# Patient Record
Sex: Female | Born: 1960 | Race: Black or African American | Hispanic: No | Marital: Married | Smoking: Never smoker
Health system: Southern US, Community
[De-identification: ages and names within clinical notes are randomized; demographics above are authoritative.]

## PROBLEM LIST (undated history)

## (undated) DIAGNOSIS — F3289 Other specified depressive episodes: Secondary | ICD-10-CM

## (undated) DIAGNOSIS — G43909 Migraine, unspecified, not intractable, without status migrainosus: Secondary | ICD-10-CM

## (undated) DIAGNOSIS — IMO0002 Reserved for concepts with insufficient information to code with codable children: Secondary | ICD-10-CM

## (undated) DIAGNOSIS — K219 Gastro-esophageal reflux disease without esophagitis: Secondary | ICD-10-CM

## (undated) DIAGNOSIS — M1990 Unspecified osteoarthritis, unspecified site: Secondary | ICD-10-CM

## (undated) DIAGNOSIS — H409 Unspecified glaucoma: Secondary | ICD-10-CM

## (undated) DIAGNOSIS — E785 Hyperlipidemia, unspecified: Secondary | ICD-10-CM

## (undated) DIAGNOSIS — F329 Major depressive disorder, single episode, unspecified: Secondary | ICD-10-CM

## (undated) DIAGNOSIS — R42 Dizziness and giddiness: Secondary | ICD-10-CM

## (undated) DIAGNOSIS — F419 Anxiety disorder, unspecified: Secondary | ICD-10-CM

## (undated) DIAGNOSIS — M129 Arthropathy, unspecified: Secondary | ICD-10-CM

## (undated) DIAGNOSIS — I10 Essential (primary) hypertension: Secondary | ICD-10-CM

## (undated) DIAGNOSIS — R002 Palpitations: Secondary | ICD-10-CM

## (undated) HISTORY — DX: Essential (primary) hypertension: I10

## (undated) HISTORY — DX: Anxiety disorder, unspecified: F41.9

## (undated) HISTORY — PX: BREAST SURGERY: SHX581

## (undated) HISTORY — DX: Unspecified glaucoma: H40.9

## (undated) HISTORY — DX: Other specified depressive episodes: F32.89

## (undated) HISTORY — DX: Dizziness and giddiness: R42

## (undated) HISTORY — DX: Unspecified osteoarthritis, unspecified site: M19.90

## (undated) HISTORY — DX: Major depressive disorder, single episode, unspecified: F32.9

## (undated) HISTORY — PX: SPINE SURGERY: SHX786

## (undated) HISTORY — DX: Arthropathy, unspecified: M12.9

## (undated) HISTORY — PX: SHOULDER SURGERY: SHX246

## (undated) HISTORY — DX: Reserved for concepts with insufficient information to code with codable children: IMO0002

## (undated) HISTORY — DX: Palpitations: R00.2

## (undated) HISTORY — DX: Gastro-esophageal reflux disease without esophagitis: K21.9

## (undated) HISTORY — DX: Hyperlipidemia, unspecified: E78.5

---

## 1982-05-20 DIAGNOSIS — R87619 Unspecified abnormal cytological findings in specimens from cervix uteri: Secondary | ICD-10-CM

## 1982-05-20 DIAGNOSIS — IMO0002 Reserved for concepts with insufficient information to code with codable children: Secondary | ICD-10-CM

## 1982-05-20 HISTORY — DX: Reserved for concepts with insufficient information to code with codable children: IMO0002

## 1982-05-20 HISTORY — PX: CERVIX LESION DESTRUCTION: SHX591

## 1982-05-20 HISTORY — DX: Unspecified abnormal cytological findings in specimens from cervix uteri: R87.619

## 1988-05-20 HISTORY — PX: TUBAL LIGATION: SHX77

## 1996-05-20 HISTORY — PX: OTHER SURGICAL HISTORY: SHX169

## 1998-07-26 ENCOUNTER — Other Ambulatory Visit: Admission: RE | Admit: 1998-07-26 | Discharge: 1998-07-26 | Payer: Self-pay | Admitting: *Deleted

## 1999-10-17 ENCOUNTER — Other Ambulatory Visit: Admission: RE | Admit: 1999-10-17 | Discharge: 1999-10-17 | Payer: Self-pay | Admitting: Obstetrics and Gynecology

## 2000-10-02 ENCOUNTER — Other Ambulatory Visit: Admission: RE | Admit: 2000-10-02 | Discharge: 2000-10-02 | Payer: Self-pay | Admitting: *Deleted

## 2000-12-12 ENCOUNTER — Encounter: Admission: RE | Admit: 2000-12-12 | Discharge: 2000-12-12 | Payer: Self-pay | Admitting: Obstetrics and Gynecology

## 2000-12-12 ENCOUNTER — Encounter: Payer: Self-pay | Admitting: Obstetrics and Gynecology

## 2001-11-12 ENCOUNTER — Other Ambulatory Visit: Admission: RE | Admit: 2001-11-12 | Discharge: 2001-11-12 | Payer: Self-pay | Admitting: Obstetrics and Gynecology

## 2002-12-01 ENCOUNTER — Other Ambulatory Visit: Admission: RE | Admit: 2002-12-01 | Discharge: 2002-12-01 | Payer: Self-pay | Admitting: Obstetrics and Gynecology

## 2003-05-25 ENCOUNTER — Other Ambulatory Visit: Admission: RE | Admit: 2003-05-25 | Discharge: 2003-05-25 | Payer: Self-pay | Admitting: Family Medicine

## 2004-04-18 ENCOUNTER — Other Ambulatory Visit: Admission: RE | Admit: 2004-04-18 | Discharge: 2004-04-18 | Payer: Self-pay | Admitting: *Deleted

## 2004-05-20 HISTORY — PX: SKIN GRAFT: SHX250

## 2004-08-20 ENCOUNTER — Emergency Department (HOSPITAL_COMMUNITY): Admission: EM | Admit: 2004-08-20 | Discharge: 2004-08-20 | Payer: Self-pay | Admitting: Emergency Medicine

## 2005-08-05 ENCOUNTER — Other Ambulatory Visit: Admission: RE | Admit: 2005-08-05 | Discharge: 2005-08-05 | Payer: Self-pay | Admitting: Obstetrics and Gynecology

## 2005-08-07 ENCOUNTER — Encounter: Admission: RE | Admit: 2005-08-07 | Discharge: 2005-08-07 | Payer: Self-pay | Admitting: Obstetrics and Gynecology

## 2005-12-31 ENCOUNTER — Ambulatory Visit (HOSPITAL_COMMUNITY): Admission: RE | Admit: 2005-12-31 | Discharge: 2006-01-01 | Payer: Self-pay | Admitting: Obstetrics & Gynecology

## 2005-12-31 ENCOUNTER — Encounter (INDEPENDENT_AMBULATORY_CARE_PROVIDER_SITE_OTHER): Payer: Self-pay | Admitting: *Deleted

## 2006-05-20 HISTORY — PX: ABDOMINAL HYSTERECTOMY: SHX81

## 2006-05-20 HISTORY — PX: OTHER SURGICAL HISTORY: SHX169

## 2006-07-25 ENCOUNTER — Other Ambulatory Visit: Admission: RE | Admit: 2006-07-25 | Discharge: 2006-07-25 | Payer: Self-pay | Admitting: Obstetrics & Gynecology

## 2006-08-19 ENCOUNTER — Emergency Department (HOSPITAL_COMMUNITY): Admission: EM | Admit: 2006-08-19 | Discharge: 2006-08-19 | Payer: Self-pay | Admitting: Family Medicine

## 2007-02-03 ENCOUNTER — Encounter: Admission: RE | Admit: 2007-02-03 | Discharge: 2007-02-03 | Payer: Self-pay | Admitting: Family Medicine

## 2008-05-20 HISTORY — PX: COLONOSCOPY: SHX174

## 2008-06-13 ENCOUNTER — Other Ambulatory Visit: Admission: RE | Admit: 2008-06-13 | Discharge: 2008-06-13 | Payer: Self-pay | Admitting: Obstetrics and Gynecology

## 2008-07-18 LAB — HM MAMMOGRAPHY: HM Mammogram: NORMAL

## 2008-09-27 DIAGNOSIS — K5900 Constipation, unspecified: Secondary | ICD-10-CM | POA: Insufficient documentation

## 2008-09-27 DIAGNOSIS — K219 Gastro-esophageal reflux disease without esophagitis: Secondary | ICD-10-CM | POA: Insufficient documentation

## 2008-09-27 DIAGNOSIS — Z87898 Personal history of other specified conditions: Secondary | ICD-10-CM | POA: Insufficient documentation

## 2008-09-28 ENCOUNTER — Encounter (INDEPENDENT_AMBULATORY_CARE_PROVIDER_SITE_OTHER): Payer: Self-pay | Admitting: *Deleted

## 2008-09-28 ENCOUNTER — Ambulatory Visit: Payer: Self-pay | Admitting: Internal Medicine

## 2008-09-28 DIAGNOSIS — F329 Major depressive disorder, single episode, unspecified: Secondary | ICD-10-CM

## 2008-09-28 DIAGNOSIS — Z9189 Other specified personal risk factors, not elsewhere classified: Secondary | ICD-10-CM | POA: Insufficient documentation

## 2008-09-28 DIAGNOSIS — R51 Headache: Secondary | ICD-10-CM | POA: Insufficient documentation

## 2008-09-28 DIAGNOSIS — J40 Bronchitis, not specified as acute or chronic: Secondary | ICD-10-CM | POA: Insufficient documentation

## 2008-09-28 DIAGNOSIS — R519 Headache, unspecified: Secondary | ICD-10-CM | POA: Insufficient documentation

## 2008-09-28 DIAGNOSIS — F32A Depression, unspecified: Secondary | ICD-10-CM | POA: Insufficient documentation

## 2008-09-28 DIAGNOSIS — I10 Essential (primary) hypertension: Secondary | ICD-10-CM | POA: Insufficient documentation

## 2008-09-28 DIAGNOSIS — M129 Arthropathy, unspecified: Secondary | ICD-10-CM | POA: Insufficient documentation

## 2008-09-28 DIAGNOSIS — H409 Unspecified glaucoma: Secondary | ICD-10-CM | POA: Insufficient documentation

## 2008-09-28 LAB — CONVERTED CEMR LAB
Eosinophils Absolute: 0.1 10*3/uL (ref 0.0–0.7)
MCHC: 34.5 g/dL (ref 30.0–36.0)
MCV: 89.9 fL (ref 78.0–100.0)
Monocytes Absolute: 0.3 10*3/uL (ref 0.1–1.0)
Neutrophils Relative %: 51 % (ref 43.0–77.0)
Platelets: 196 10*3/uL (ref 150.0–400.0)
RDW: 15.4 % — ABNORMAL HIGH (ref 11.5–14.6)

## 2008-10-03 ENCOUNTER — Ambulatory Visit: Payer: Self-pay | Admitting: Internal Medicine

## 2008-10-11 ENCOUNTER — Ambulatory Visit: Payer: Self-pay | Admitting: Internal Medicine

## 2009-02-06 ENCOUNTER — Encounter: Admission: RE | Admit: 2009-02-06 | Discharge: 2009-02-06 | Payer: Self-pay | Admitting: Specialist

## 2009-03-15 ENCOUNTER — Emergency Department (HOSPITAL_COMMUNITY): Admission: EM | Admit: 2009-03-15 | Discharge: 2009-03-15 | Payer: Self-pay | Admitting: Emergency Medicine

## 2009-06-20 ENCOUNTER — Encounter: Payer: Self-pay | Admitting: Internal Medicine

## 2009-06-20 HISTORY — PX: OTHER SURGICAL HISTORY: SHX169

## 2009-06-28 ENCOUNTER — Inpatient Hospital Stay (HOSPITAL_COMMUNITY): Admission: RE | Admit: 2009-06-28 | Discharge: 2009-06-29 | Payer: Self-pay | Admitting: *Deleted

## 2009-08-08 ENCOUNTER — Encounter: Payer: Self-pay | Admitting: Internal Medicine

## 2010-02-02 ENCOUNTER — Encounter: Payer: Self-pay | Admitting: Internal Medicine

## 2010-02-02 ENCOUNTER — Ambulatory Visit: Payer: Self-pay | Admitting: Internal Medicine

## 2010-02-02 DIAGNOSIS — R0789 Other chest pain: Secondary | ICD-10-CM | POA: Insufficient documentation

## 2010-03-13 ENCOUNTER — Telehealth (INDEPENDENT_AMBULATORY_CARE_PROVIDER_SITE_OTHER): Payer: Self-pay | Admitting: *Deleted

## 2010-03-14 ENCOUNTER — Ambulatory Visit (HOSPITAL_COMMUNITY): Admission: RE | Admit: 2010-03-14 | Discharge: 2010-03-14 | Payer: Self-pay | Admitting: Internal Medicine

## 2010-03-14 ENCOUNTER — Encounter: Payer: Self-pay | Admitting: Internal Medicine

## 2010-03-14 ENCOUNTER — Ambulatory Visit: Payer: Self-pay

## 2010-03-14 ENCOUNTER — Ambulatory Visit: Payer: Self-pay | Admitting: Cardiovascular Disease

## 2010-06-19 NOTE — Miscellaneous (Signed)
Summary: Appointment Canceled  Appointment status changed to canceled by [employer] on 02/27/2010 4:48 PM.  Cancellation Comments --------------------- ech/chest discomfort-mb  Appointment Information ----------------------- Appt Type:  CARDIOLOGY ANCILLARY VISIT      Date:  Wednesday, February 28, 2010      Time:  2:00 PM for 60 min   Urgency:  Routine   Made By:  Hoy Finlay Scheduler  To Visit:  [id number]    Reason:  ech/chest discomfort-mb  Appt Comments ------------- -- 02/27/10 16:48: (CEMR) CANCELED -- ech/chest discomfort-mb -- 02/14/10 13:10: (CEMR) BOOKED -- Routine CARDIOLOGY ANCILLARY VISIT at 02/28/2010 2:00 PM for 60 min ech/chest discomfort-mb

## 2010-06-19 NOTE — Progress Notes (Signed)
Summary: Stress  Echo pre procedure  Phone Note Outgoing Call Call back at Home Phone [phone]   Call placed by: Rea College, CMA,  March 13, 2010 4:20 PM Call placed to: Patient Summary of Call: Spoke with patient's ? friend/son, about instructions for stress echo.

## 2010-06-19 NOTE — Miscellaneous (Signed)
Summary: Appointment Canceled  Appointment status changed to canceled by [employer] on 02/14/2010 1:10 PM.  Cancellation Comments --------------------- ecs/chest discomfort-mb  Appointment Information ----------------------- Appt Type:  EMR NO DOCUMENT      Date:  Thursday, February 15, 2010      Time:  2:00 PM for 30 min   Urgency:  Routine   Made By:  Pearson Grippe  To Visit:  [id number]    Reason:  ecs/chest discomfort-mb  Appt Comments ------------- -- 02/14/10 13:10: (CEMR) CANCELED -- ecs/chest discomfort-mb -- 02/02/10 13:07: (CEMR) BOOKED -- Routine EMR NO DOCUMENT at 02/15/2010 2:00 PM for 30 min ecs/chest discomfort-mb

## 2010-06-19 NOTE — Letter (Signed)
Summary: [facility]  [facility]   Imported By: Lester Loving 08/31/2009 11:24:06  _____________________________________________________________________  External Attachment:    Type:   Image     Comment:   External Document

## 2010-06-19 NOTE — Miscellaneous (Signed)
Summary: Appointment Canceled  Appointment status changed to canceled by [employer] on 02/14/2010 1:10 PM.  Cancellation Comments --------------------- ech/chest discomfort-mb  Appointment Information ----------------------- Appt Type:  CARDIOLOGY ANCILLARY VISIT      Date:  Thursday, February 15, 2010      Time:  2:00 PM for 60 min   Urgency:  Routine   Made By:  Hoy Finlay Scheduler  To Visit:  [id number]    Reason:  ech/chest discomfort-mb  Appt Comments ------------- -- 02/14/10 13:10: (CEMR) CANCELED -- ech/chest discomfort-mb -- 02/02/10 13:07: (CEMR) BOOKED -- Routine CARDIOLOGY ANCILLARY VISIT at 02/15/2010 2:00 PM for 60 min ech/chest discomfort-mb

## 2010-06-19 NOTE — Assessment & Plan Note (Signed)
Summary: OV--STRESS TEST DISCUSSION---STC   Vital Signs:  Patient profile:   50 year old female Height:      61 inches (154.94 cm) Weight:      174.0 pounds (79.09 kg) BMI:     33.00 O2 Sat:      98 % on Room air Temp:     98.4 degrees F (36.89 degrees C) oral Pulse rate:   93 / minute BP sitting:   132 / 82  (left arm) Cuff size:   large  Vitals Entered By: Orlan Leavens RMA (February 02, 2010 10:37 AM)  O2 Flow:  Room air CC: Discuss stress test Is Patient Diabetic? No Pain Assessment Patient in pain? no        Primary Care Provider:  Newt Lukes MD  CC:  Discuss stress test.  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  anxiety/depression - hx same - reports compliance with ongoing medical treatment and no changes in medication dose or frequency in many years. denies adverse side effects related to curent therapy. ?if needs alt med to help with these symptoms if stress test normal - exac by persisitng leg/back pain after surg 06/2009 for ruptured disc 04/2009  Chronic migraines. [facility] She takes daily BC powders despite previous advice to decrease her use of this medication. Headaches have been going on daily for the last 4-5 years, associated with decline in health of her mother. Mother has passed away Oct 26, 2007. 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 uses Imitrex as needed   history of anemia in the 90s. She denies any current fatigue. No bright red blood per rectum or melena. no heavy menstruation.  Positive occasional reflux and abdominal pain  associated with BC powder use  GERD symptoms. not on regular medication for this. Associated indigestion and heartburn with BC powder use. No known history of ulcers. no recent endoscopy evaluation in the last 5 years  constipation. Chronic history of same, but now worse in the last year. Takes IT via supplements without improvement. Strains for small hard bowel movement, which occurs every 3 days or so. No intermittent diarrhea. No specific lower abdominal pain.   Current Medications (verified): 1)  Effexor Xr 37.5 Mg Xr24h-Cap (Venlafaxine Hcl) .... Take 1 Two Times A Day 2)  Ambien Cr 12.5 Mg Cr-Tabs (Zolpidem Tartrate) .... Take 1 At Bedtime 3)  Imitrex 50 Mg Tabs (Sumatriptan Succinate) .Marland Kitchen.. 1 By Mouth Daily As Needed For Migraine Symptoms - 26-Oct-2022 Repeat X1 (Max 100mg /24g) 4)  Sumatriptan 5 Mg/act Soln (Sumatriptan) .... Use Once Then Repeat Q 4 Hours As Needed 5)  Celebrex 200 Mg Caps (Celecoxib) .... Take 1 By Mouth Once Daily 6)  Enjuvia 0.625 Mg Tabs (Estrogens Conj Synthetic B) .... Take 1 By Mouth Once Daily  Allergies (verified): 1)  ! 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  The patient denies abdominal pain and difficulty walking.    Physical Exam  General:  Well developed, well nourished, no acute distress. Lungs:  normal respiratory effort, no intercostal retractions or use of accessory muscles; normal breath sounds bilaterally - no crackles and  no wheezes.    Heart:  normal rate, regular rhythm, no murmur, and no rub. BLE without edema. Psych:  Alert and cooperative. Normal mood and affect. occ tearful   Impression & Recommendations:  Problem # 1:  CHEST DISCOMFORT (ICD-786.59)  may represent anxiety symptoms and panic as pt very "stressed" however given FH ASD, will proceed with stress test to r/o cardiac abn EKG today f/u 2-4 weeks on results and ?change in anxiety/depression meds or tx of shidt wrok sleep disorder exac her chest symptoms  explained same to pt who understands and agrees  Orders: Echo- Stress (Stress Echo) EKG w/ Interpretation (93000)  Problem # 2:  DEPRESSION (ICD-311) exac with anxiety symptoms last few months - may need change in meds if cardiac eval ok Her updated medication list for this problem includes:    Effexor Xr 37.5 Mg Xr24h-cap (Venlafaxine hcl) .Marland Kitchen... Take 1 two times a day  Problem # 3:  HYPERTENSION (ICD-401.9)  Orders: Echo- Stress (Stress Echo) EKG w/ Interpretation (93000)  BP today: 132/82 Prior BP: 104/70 (10/03/2008)  Complete Medication List: 1)  Effexor Xr 37.5 Mg Xr24h-cap (Venlafaxine hcl) .... Take 1 two times a day 2)  Ambien Cr 12.5 Mg Cr-tabs (Zolpidem tartrate) .... Take 1 at bedtime 3)  Imitrex 50 Mg Tabs (Sumatriptan succinate) .Marland Kitchen.. 1 by mouth daily as needed for migraine symptoms - may repeat x1 (max 100mg /24g) 4)  Sumatriptan 5 Mg/act Soln (Sumatriptan) .... Use once then repeat q 4 hours as needed 5)  Celebrex 200 Mg Caps (Celecoxib) .... Take 1 by mouth once daily 6)  Enjuvia 0.625 Mg Tabs (Estrogens conj synthetic b) .... Take 1 by mouth once daily  Patient Instructions: 1)  it was good to see you today. 2)  exam and EKG look good - 3)  we'll make referral for cardiac stress test. Our office will contact you regarding this appointment once made.  4)  Please schedule a follow-up appointment in 2-4 weeks to review results and discuss possible medication  changes for depression/anxiety; call sooner if problems.

## 2010-06-19 NOTE — Miscellaneous (Signed)
Summary: Appointment Canceled  Appointment status changed to canceled by [employer] on 02/27/2010 4:48 PM.  Cancellation Comments --------------------- ecs/chest discomfort-mb  Appointment Information ----------------------- Appt Type:  EMR NO DOCUMENT      Date:  Wednesday, February 28, 2010      Time:  2:00 PM for 30 min   Urgency:  Routine   Made By:  Pearson Grippe  To Visit:  [id number]    Reason:  ecs/chest discomfort-mb  Appt Comments ------------- -- 02/27/10 16:48: (CEMR) CANCELED -- ecs/chest discomfort-mb -- 02/14/10 13:10: (CEMR) BOOKED -- Routine EMR NO DOCUMENT at 02/28/2010 2:00 PM for 30 min ecs/chest discomfort-mb

## 2010-06-19 NOTE — Letter (Signed)
Summary: [facility]  [facility]   Imported By: Sherian Rein 06/28/2009 09:58:00  _____________________________________________________________________  External Attachment:    Type:   Image     Comment:   External Document

## 2010-07-18 ENCOUNTER — Emergency Department (HOSPITAL_COMMUNITY): Payer: 59

## 2010-07-18 ENCOUNTER — Emergency Department (HOSPITAL_COMMUNITY)
Admission: EM | Admit: 2010-07-18 | Discharge: 2010-07-18 | Disposition: A | Discharge status: Home / Self Care | Payer: 59 | Attending: Emergency Medicine | Admitting: Emergency Medicine

## 2010-07-18 DIAGNOSIS — Z79890 Hormone replacement therapy (postmenopausal): Secondary | ICD-10-CM | POA: Insufficient documentation

## 2010-07-18 DIAGNOSIS — J40 Bronchitis, not specified as acute or chronic: Secondary | ICD-10-CM | POA: Insufficient documentation

## 2010-07-18 DIAGNOSIS — Z79899 Other long term (current) drug therapy: Secondary | ICD-10-CM | POA: Insufficient documentation

## 2010-07-18 DIAGNOSIS — G43909 Migraine, unspecified, not intractable, without status migrainosus: Secondary | ICD-10-CM | POA: Insufficient documentation

## 2010-07-20 ENCOUNTER — Encounter: Payer: Self-pay | Admitting: Internal Medicine

## 2010-07-20 ENCOUNTER — Ambulatory Visit (INDEPENDENT_AMBULATORY_CARE_PROVIDER_SITE_OTHER): Payer: 59 | Admitting: Internal Medicine

## 2010-07-20 DIAGNOSIS — R05 Cough: Secondary | ICD-10-CM | POA: Insufficient documentation

## 2010-07-20 DIAGNOSIS — R059 Cough, unspecified: Secondary | ICD-10-CM

## 2010-07-26 NOTE — Assessment & Plan Note (Signed)
Summary: COUGH---STC   Vital Signs:  Patient profile:   50 year old female O2 Sat:      99 % on Room air Temp:     98.2 degrees F (36.78 degrees C) oral Pulse rate:   76 / minute BP sitting:   112 / 82  (left arm) Cuff size:   large  Vitals Entered By: Orlan Leavens RMA (July 20, 2010 11:26 AM)  O2 Flow:  Room air CC: Cough Is Patient Diabetic? No Pain Assessment Patient in pain? no        Primary Care Provider:  Newt Lukes MD  CC:  Cough.  History of Present Illness: c/o cough - onset 2 weeks ago - seen at [facility] for same 07/18/10 - cxr reviewed (NAD) tx tessalon - not improved - denies sputum or fever - dry irritated throat and constant cough - symptoms worse at night or lying down - no allg rhinitis, HA or hemopytsis - no SOB or wheeze, no CP  reviewed other med issues: HTN - not on meds for same but "watches" BP - follows low salt diet - no HA or edema.  anxiety/depression - hx same - reports compliance with ongoing medical treatment and no changes in medication dose or frequency in many years. denies adverse side effects related to curent therapy.  exac by persisitng leg/back pain after surg 06/2009 for ruptured disc 04/2009  Chronic migraines. [facility] She takes daily BC powders despite previous advice to decrease her use of this medication. Headaches have been going on daily for the last 4-5 years, associated with decline in health of her mother. Mother has passed away 2007/10/01. 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 uses Imitrex as needed   history of anemia in the 90s. She denies any current fatigue. No bright red blood per rectum or melena. no heavy menstruation.  Positive occasional reflux and abdominal pain associated with BC powder use  GERD symptoms. not on regular medication for this. Associated indigestion and heartburn with BC powder use. No known history of ulcers. no recent endoscopy evaluation     constipation. Chronic history of same, but now worse in the last year. Takes IT via supplements without improvement. Strains for small hard bowel movement, which occurs every 3 days or so. No intermittent diarrhea. No specific lower abdominal pain.   Current Medications (verified): 1)  Sumatriptan 5 Mg/act Soln (Sumatriptan) .... Use Once Then Repeat Q 4 Hours As Needed 2)  Celebrex 200 Mg Caps (Celecoxib) .... Take 1 By Mouth Once Daily 3)  Enjuvia 0.625 Mg Tabs (Estrogens Conj Synthetic B) .... Take 1 By Mouth Once Daily 4)  Benzonatate 100 Mg Caps (Benzonatate) .... Take 1 Two Times A Day As Needed For Cough  Allergies (verified): 1)  ! Penicillin  Past History:  Past Medical History: GERD Depression  Hypertension Anxiety Disorder   MD roster: [facility] - [facility] ([facility]  Review of Systems  The patient denies anorexia, fever, weight loss, syncope, dyspnea on exertion, peripheral edema, and abdominal pain.    Physical Exam  General:  Well developed, well nourished, no acute distress but dry cough spasms - son at side Ears:  R ear normal and L ear normal.   Mouth:  teeth and gums in good repair; mucous membranes moist, without lesions or ulcers. oropharynx clear without exudate, mod erythema.  Lungs:  normal respiratory effort, no  intercostal retractions or use of accessory muscles; normal breath sounds bilaterally - no crackles and no wheezes. cough with deep inspiration or talking    Heart:  normal rate, regular rhythm, no murmur, and no rub. BLE without edema. Psych:  Alert and cooperative. Normal mood and affect.   Impression & Recommendations:  Problem # 1:  COUGH (ICD-786.2)  reviewed ER visit 07/18/10 at [facility] - neg CXR, no labs afeb but symptoms ongoing >2 weeks - emperic zpak and change tessalon to narc cough supression - also tx underlying reflux and encourage voice rest with hard candy f/u if unimproved, sooner if  worse  Orders: Prescription Created Electronically [phone])  Complete Medication List: 1)  Sumatriptan 5 Mg/act Soln (Sumatriptan) .... Use once then repeat q 4 hours as needed 2)  Celebrex 200 Mg Caps (Celecoxib) .... Take 1 by mouth once daily 3)  Enjuvia 0.625 Mg Tabs (Estrogens conj synthetic b) .... Take 1 by mouth once daily 4)  Azithromycin 250 Mg Tabs (Azithromycin) .... 2 tabs by mouth today, then 1 by mouth daily starting tomorrow 5)  Omeprazole 40 Mg Cpdr (Omeprazole) .Marland Kitchen.. 1 by mouth once daily x 10days, then as needed for reflux symptoms 6)  Promethazine-codeine 6.25-10 Mg/31ml Syrp (Promethazine-codeine) .... 5 cc by mouth four times a day as needed for cough symptoms (especially nighttime)  Patient Instructions: 1)  it was good to see you today. 2)  Zpak for antibiotics 3)  omeprazole 40mg  one before dinner for 10 days only. 4)  codeine cough syrup as directed 5)  your prescriptions have been submitted to your pharmacy. Please take as directed. Contact our office if you believe you're having problems with the medication(s).  6)  hard candy to bathe back of throat during day, no throat clearing 7)  limit voice use, whisper!! no singing for next 2 weeks.  8)  Get plenty of rest, drink lots of clear liquids, and use Tylenol or Ibuprofen for fever and comfort. Return in 7-10 days if you're not better:sooner if you're feeling worse. Prescriptions: PROMETHAZINE-CODEINE 6.25-10 MG/5ML SYRP (PROMETHAZINE-CODEINE) 5 cc by mouth four times a day as needed for cough symptoms (especially nighttime)  #8 oz x 0   Entered and Authorized by:   Newt Lukes MD   Signed by:   Newt Lukes MD on 07/20/2010   Method used:   Printed then faxed to ...       [facility] [address] [phone]* (retail)       [address]       [address]       [place], [place]  [address]       Ph: [phone]       Fax: [phone]   RxID:   [id number] OMEPRAZOLE 40 MG CPDR (OMEPRAZOLE) 1 by  mouth once daily x 10days, then as needed for reflux symptoms  #30 x 0   Entered and Authorized by:   Newt Lukes MD   Signed by:   Newt Lukes MD on 07/20/2010   Method used:   Electronically to        [facility] [address] [phone]* (retail)       [address]       [address]       [place], [place]  [address]       Ph: [phone]       Fax: [phone]   RxID:   [id number] AZITHROMYCIN 250 MG TABS (AZITHROMYCIN) 2 tabs by  mouth today, then 1 by mouth daily starting tomorrow  #6 x 0   Entered and Authorized by:   Newt Lukes MD   Signed by:   Newt Lukes MD on 07/20/2010   Method used:   Electronically to        [facility] [address] [phone]* (retail)       [address]       [address]       [place], [place]  [address]       Ph: [phone]       Fax: [phone]   RxID:   [id number]    Orders Added: 1)  Est. Patient Level IV [72536] 2)  Prescription Created Electronically [phone]

## 2010-08-08 LAB — COMPREHENSIVE METABOLIC PANEL
ALT: 34 U/L (ref 0–35)
AST: 34 U/L (ref 0–37)
Albumin: 4.1 g/dL (ref 3.5–5.2)
Alkaline Phosphatase: 100 U/L (ref 39–117)
Calcium: 10 mg/dL (ref 8.4–10.5)
GFR calc Af Amer: >60 mL/min (ref >60)
Potassium: 4.1 mEq/L (ref 3.5–5.1)
Sodium: 139 mEq/L (ref 135–145)
Total Protein: 7.3 g/dL (ref 6.0–8.3)

## 2010-08-08 LAB — DIFFERENTIAL
Basophils Relative: 0 % (ref 0–1)
Eosinophils Absolute: 0.2 10*3/uL (ref 0.0–0.7)
Eosinophils Relative: 4 % (ref 0–5)
Lymphs Abs: 1.9 10*3/uL (ref 0.7–4.0)
Monocytes Absolute: 0.4 10*3/uL (ref 0.1–1.0)
Monocytes Relative: 8 % (ref 3–12)

## 2010-08-08 LAB — PROTIME-INR: INR: 0.94 (ref 0.00–1.49)

## 2010-08-08 LAB — URINALYSIS, ROUTINE W REFLEX MICROSCOPIC
Bilirubin Urine: NEGATIVE
Nitrite: NEGATIVE
Specific Gravity, Urine: 1.013 (ref 1.005–1.030)
Urobilinogen, UA: 0.2 mg/dL (ref 0.0–1.0)
pH: 7 (ref 5.0–8.0)

## 2010-08-08 LAB — TYPE AND SCREEN: Antibody Screen: NEGATIVE

## 2010-08-08 LAB — CBC
RBC: 4.44 MIL/uL (ref 3.87–5.11)
WBC: 5.6 10*3/uL (ref 4.0–10.5)

## 2010-08-09 ENCOUNTER — Inpatient Hospital Stay (INDEPENDENT_AMBULATORY_CARE_PROVIDER_SITE_OTHER)
Admission: RE | Admit: 2010-08-09 | Discharge: 2010-08-09 | Disposition: A | Discharge status: Home / Self Care | Payer: 59 | Source: Ambulatory Visit | Attending: Emergency Medicine | Admitting: Emergency Medicine

## 2010-08-09 ENCOUNTER — Ambulatory Visit (INDEPENDENT_AMBULATORY_CARE_PROVIDER_SITE_OTHER): Payer: 59

## 2010-08-09 DIAGNOSIS — R05 Cough: Secondary | ICD-10-CM

## 2010-08-09 DIAGNOSIS — R059 Cough, unspecified: Secondary | ICD-10-CM

## 2010-08-09 DIAGNOSIS — J9819 Other pulmonary collapse: Secondary | ICD-10-CM

## 2010-08-10 ENCOUNTER — Ambulatory Visit: Payer: 59 | Admitting: Internal Medicine

## 2010-08-14 ENCOUNTER — Encounter: Payer: Self-pay | Admitting: Internal Medicine

## 2010-08-16 ENCOUNTER — Other Ambulatory Visit: Payer: Self-pay | Admitting: Obstetrics & Gynecology

## 2010-08-16 DIAGNOSIS — Z1231 Encounter for screening mammogram for malignant neoplasm of breast: Secondary | ICD-10-CM

## 2010-08-17 ENCOUNTER — Encounter: Payer: Self-pay | Admitting: Internal Medicine

## 2010-08-21 ENCOUNTER — Ambulatory Visit (INDEPENDENT_AMBULATORY_CARE_PROVIDER_SITE_OTHER): Payer: 59 | Admitting: Internal Medicine

## 2010-08-21 ENCOUNTER — Encounter: Payer: Self-pay | Admitting: Internal Medicine

## 2010-08-21 VITALS — BP 114/72 | HR 79 | Temp 98.5°F | Ht 61.0 in | Wt 174.0 lb

## 2010-08-21 DIAGNOSIS — R059 Cough, unspecified: Secondary | ICD-10-CM

## 2010-08-21 DIAGNOSIS — G4700 Insomnia, unspecified: Secondary | ICD-10-CM | POA: Insufficient documentation

## 2010-08-21 DIAGNOSIS — R05 Cough: Secondary | ICD-10-CM

## 2010-08-21 MED ORDER — AMITRIPTYLINE HCL 10 MG PO TABS: 10.0000 mg | ORAL_TABLET | Freq: Every day | ORAL | Status: DC

## 2010-08-21 NOTE — Progress Notes (Signed)
Subjective:    Patient ID: Rachael Fleming, female    DOB: 1960-05-29, 50 y.o.   MRN: [id number]  HPI  50 year old woman here today for followup cough - onset 06/2010 - seen at [facility] for same 07/18/10 - cxr reviewed (NAD) tx tessalon - not improved - denies sputum or fever - dry irritated throat and constant cough - symptoms worse at night or lying down - no allg rhinitis, HA or hemopytsis - no SOB or wheeze, no CP - treated with zpak empericallt, PPI and narcotic cough syrup - still not improved so 2nd trip to ER 3/29 - repeat CXR with LLLatx - tx pred - now improved but not resolved - concerned due to exposure to work environment  Reviewed chronic med issues also: HTN - not on meds for same but "watches" BP - follows low salt diet - no HA or edema.  anxiety/depression - hx same - reports compliance with ongoing medical treatment and no changes in medication dose or frequency in many years. denies adverse side effects related to curent therapy. C/o poor sleep - not helped with ambein  Chronic migraines. [facility]. She takes daily BC powders despite previous advice to decrease her use of this medication. Headaches have been going on daily for the last 4-5 years, associated with decline in health, then death of her mother in 10/12/07. 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 uses Imitrex as needed   history of anemia in the 90s. She denies any current fatigue. No bright red blood per rectum or melena. no heavy menstruation.  Positive occasional reflux and abdominal pain associated with BC powder use  GERD symptoms. not on regular medication for this. Associated indigestion and heartburn with BC powder use. No known history of ulcers. no recent endoscopy evaluation in the last 5 years  constipation. Chronic history of same, but now worse in the last year. Takes supplements without improvement. Strains for small hard bowel movement, which occurs  every 3 days or so. No intermittent diarrhea. No specific lower abdominal pain.   Past Medical History  Diagnosis Date  . DEPRESSION 10-11-08  . GLAUCOMA Oct 11, 2008  . HYPERTENSION 10-11-2008  . GERD 09/27/2008  . ARTHRITIS 10/11/08  . Headache Oct 11, 2008  . MIGRAINES, HX OF 09/27/2008    Review of Systems  Respiratory: Negative for shortness of breath.   Genitourinary: Negative for dysuria.  Neurological: Negative for dizziness, tremors and syncope.      Objective:   Physical Exam BP 114/72  Pulse 79  Temp(Src) 98.5 F (36.9 C) (Oral)  Ht 5\' 1"  (1.549 m)  Wt 174 lb (78.926 kg)  BMI 32.88 kg/m2 Physical Exam  Constitutional: She is oriented to person, place, and time. She appears well-developed and well-nourished. No distress.  Eyes: Conjunctivae and EOM are normal. Pupils are equal, round, and reactive to light. No scleral icterus.  Neck: Normal range of motion. Neck supple. No JVD present. No thyromegaly present.  Cardiovascular: Normal rate, regular rhythm and normal heart sounds.  No murmur heard. Pulmonary/Chest: Effort normal and breath sounds normal. No respiratory distress. She has no wheezes.     Lab Results  Component Value Date   WBC 5.6 06/26/2009   HGB 14.0 06/26/2009   HCT 41.3 06/26/2009   PLT 205 06/26/2009   ALT 34 06/26/2009   AST 34 06/26/2009   NA 139 06/26/2009   K 4.1 06/26/2009   CL 102 06/26/2009   CREATININE 0.79  06/26/2009   BUN 8 06/26/2009   CO2 31 06/26/2009   INR 0.94 06/26/2009     Assessment & Plan:  See problem list. Medications, CXRs and labs reviewed today.

## 2010-08-21 NOTE — Patient Instructions (Signed)
It was good to see you today. Use amitriptyline for sleep as discussed - Your prescription(s) have been submitted to your pharmacy. Please take as directed and contact our office if you believe you are having problem(s) with the medication(s). we'll make referral to pulmonary and for CT chest to evaluate cough symptoms. Our office will contact you regarding appointment(s) once made.

## 2010-08-21 NOTE — Assessment & Plan Note (Signed)
Ongoing >6 weeks - nonsmoker but works in tobacco factory No other red flags on exam or hx Prior CXR reviewed - check CT chest and refer to pulm

## 2010-08-21 NOTE — Assessment & Plan Note (Signed)
largely related to shift work issues, ?depression Try low dose amitriptyline as prior tx ambein ineffective

## 2010-08-28 ENCOUNTER — Ambulatory Visit: Payer: 59

## 2010-08-30 ENCOUNTER — Ambulatory Visit (INDEPENDENT_AMBULATORY_CARE_PROVIDER_SITE_OTHER)
Admission: RE | Admit: 2010-08-30 | Discharge: 2010-08-30 | Disposition: A | Discharge status: Home / Self Care | Payer: 59 | Source: Ambulatory Visit | Attending: Internal Medicine | Admitting: Internal Medicine

## 2010-08-30 ENCOUNTER — Telehealth: Payer: Self-pay | Admitting: Internal Medicine

## 2010-08-30 DIAGNOSIS — R059 Cough, unspecified: Secondary | ICD-10-CM

## 2010-08-30 DIAGNOSIS — R05 Cough: Secondary | ICD-10-CM

## 2010-08-30 NOTE — Telephone Encounter (Signed)
Please call patient - normal CT chest results. No medication changes recommended. Thanks.

## 2010-08-30 NOTE — Telephone Encounter (Signed)
Pt Notified with CT results.Marland KitchenMarland Kitchen4/12/12@4 :19pm/LMB

## 2010-08-31 ENCOUNTER — Ambulatory Visit
Admission: RE | Admit: 2010-08-31 | Discharge: 2010-08-31 | Disposition: A | Discharge status: Home / Self Care | Payer: 59 | Source: Ambulatory Visit | Attending: Obstetrics & Gynecology | Admitting: Obstetrics & Gynecology

## 2010-08-31 DIAGNOSIS — Z1231 Encounter for screening mammogram for malignant neoplasm of breast: Secondary | ICD-10-CM

## 2010-09-17 ENCOUNTER — Institutional Professional Consult (permissible substitution): Payer: 59 | Admitting: Internal Medicine

## 2010-09-20 ENCOUNTER — Other Ambulatory Visit: Payer: Self-pay | Admitting: Internal Medicine
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
## 2010-10-05 NOTE — Op Note (Signed)
NAMEJOURNIEE, Rachael Fleming            ACCOUNT NO.:  [id number]   MEDICAL RECORD NO.:  [id number]          PATIENT TYPE:  OIB   LOCATION:  9311                          [facility]ACILITY:  [facility]   PHYSICIAN:  Martina Sinner, MD DATE OF BIRTH:  04/26/1961   DATE OF PROCEDURE:  12/31/2005  DATE OF DISCHARGE:                                 OPERATIVE REPORT   PREOPERATIVE DIAGNOSIS:  Stress urinary continence.   POSTOPERATIVE DIAGNOSIS:  Stress urinary continence.   SURGERY:  Sling cystourethropexy [facility]) plus cystoscopy.   [name] has stress urinary incontinence.  She underwent a transvaginal  hysterectomy by Dr. Hyacinth Meeker.  She consented for preoperative sling  cystourethropexy.   The patient was prepped and draped in usual fashion by the gynecology team.  After the hysterectomy I inserted a vaginal speculum, used a curved  cerebellar retractor and Foley catheter for exposure.  I made two 1 cm skin  incisions 1.5 cm lateral to the midline, one fingerbreadth above of the  symphysis pubis.   I then made a 2-1/2 inch incision in the anterior vaginal wall overlying the  mid urethra.  I made the incision quite thick to prevent extrusion.  Lidocaine and epinephrine was utilized.  I was able to palpate the pubic  bone bilaterally at the level urethrovesical angle.   The bladder emptied, I passed the [facility] needle on top and along the back of  the pubic bone onto the pulp my left index finger delivering the needle.   Then cystoscoped the patient with a 30 and 70 degree lens with the bladder  nearly full.  I wiggled each trocar.  There was no injury to the bladder.  There was no injury of trocar into the bladder or urethra.  There is efflux  of indigo carmine for both ureteral orifices.   With the bladder empty, I then attached a sling in the usual fashion,  brought it up through the retropubic space.  I tensioned it using a Kelly  clamp.  I used my usual technique.  I could descend the sling  even lateral  to the midline.  I was happy with the position and tension.   Copious irrigation was utilized.  I closed the anterior vaginal wall with  running 2-0 Vicryl followed by two interrupted stitches.  I closed the skin  with 4-0 Vicryl and Dermabond.   The Foley catheter was draining well at the end of the case.   ESTIMATED BLOOD LOSS:  Less than 50 mL.   Hopefully this procedure will reach the treatment goal.           ______________________________  Martina Sinner, MD  Electronically Signed     SAM/MEDQ  D:  12/31/2005  T:  12/31/2005  Job:  [id number] [id number]
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
## 2011-03-13 ENCOUNTER — Encounter: Payer: Self-pay | Admitting: Internal Medicine

## 2011-03-13 ENCOUNTER — Ambulatory Visit (INDEPENDENT_AMBULATORY_CARE_PROVIDER_SITE_OTHER): Payer: 59 | Admitting: Internal Medicine

## 2011-03-13 VITALS — BP 102/72 | HR 71 | Temp 98.0°F | Ht 62.0 in | Wt 178.4 lb

## 2011-03-13 DIAGNOSIS — F329 Major depressive disorder, single episode, unspecified: Secondary | ICD-10-CM

## 2011-03-13 DIAGNOSIS — F411 Generalized anxiety disorder: Secondary | ICD-10-CM

## 2011-03-13 DIAGNOSIS — F419 Anxiety disorder, unspecified: Secondary | ICD-10-CM

## 2011-03-13 DIAGNOSIS — F3289 Other specified depressive episodes: Secondary | ICD-10-CM

## 2011-03-13 MED ORDER — SERTRALINE HCL 25 MG PO TABS: 25.0000 mg | ORAL_TABLET | Freq: Every day | ORAL | Status: DC

## 2011-03-13 MED ORDER — ALPRAZOLAM 0.5 MG PO TABS: 0.5000 mg | ORAL_TABLET | Freq: Three times a day (TID) | ORAL | Status: DC | PRN

## 2011-03-13 NOTE — Patient Instructions (Signed)
It was good to see you today. Start sertraline for everyday control of anxiety symptoms and use xanax as needed for flare up panic symptoms - Your prescription(s) have been submitted to your pharmacy. Please take as directed and contact our office if you believe you are having problem(s) with the medication(s). Continue counseling with hospice and call if symptoms worse -  Please schedule followup in 4-6 weeks to review symptoms, call sooner if problems.

## 2011-03-13 NOTE — Assessment & Plan Note (Signed)
History of same (with death of mother )but off medication for last few years Current symptoms precipitated by illness her father (bone cancer stage IV) and marital stress As already enrolled in counseling with hospice Start SSRI sertraline now with when necessary Xanax - erx done Risk- benefit potential of medications discussed and patient agrees to same Denies SI Followup 4-6 weeks to review meds and symptoms, patient call sooner if worse

## 2011-03-13 NOTE — Progress Notes (Signed)
Subjective:    Patient ID: Rachael Fleming, female    DOB: 1960-06-30, 50 y.o.   MRN: [id number]  Anxiety Presents for initial visit. Onset was 1 to 4 weeks ago. The problem has been gradually worsening. Symptoms include insomnia, irritability, nervous/anxious behavior and panic. Patient reports no dizziness, shortness of breath or suicidal ideas. Symptoms occur constantly. The severity of symptoms is causing significant distress. Exacerbated by: precipitated by dad's illness, stress with spouse ?separation pending. The quality of sleep is poor. Nighttime awakenings: one to two.   Risk factors include a major life event and marital problems. Her past medical history is significant for anxiety/panic attacks. Past treatments include counseling (CBT) and SSRIs. The treatment provided moderate relief.    Also reviewed chronic medical issues: HTN - not on meds for same but "watches" BP - follows low salt diet - no HA or edema.  anxiety/depression - see above   Chronic migraines. [facility]s [facility] for same. She takes daily BC powders despite previous advice to decrease her use of this medication. Headaches have been going on daily for the last 4-5 years, associated with decline in health, then death of her mother in Oct 07, 2007. 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 uses Imitrex as needed   history of anemia in the 90s. She denies any current fatigue. No bright red blood per rectum or melena. no heavy menstruation.  Positive occasional reflux and abdominal pain associated with BC powder use  GERD symptoms. not on regular medication for this. Associated indigestion and heartburn with BC powder use. No known history of ulcers. no recent endoscopy evaluation in the last 5 years  constipation. Chronic history of same, but now worse in the last year. Takes supplements without improvement. Strains for small hard bowel movement, which occurs every 3 days or so. No  intermittent diarrhea. No specific lower abdominal pain.   Past Medical History  Diagnosis Date  . DEPRESSION   . GLAUCOMA   . HYPERTENSION   . GERD   . ARTHRITIS     Review of Systems  Constitutional: Positive for irritability.  Respiratory: Negative for shortness of breath.   Genitourinary: Negative for dysuria.  Neurological: Negative for dizziness, tremors and syncope.  Psychiatric/Behavioral: Negative for suicidal ideas. The patient is nervous/anxious and has insomnia.       Objective:   Physical Exam  BP 102/72  Pulse 71  Temp(Src) 98 F (36.7 C) (Oral)  Ht 5\' 2"  (1.575 m)  Wt 178 lb 6.4 oz (80.922 kg)  BMI 32.63 kg/m2  SpO2 96%   Wt Readings from Last 3 Encounters:  03/13/11 178 lb 6.4 oz (80.922 kg)  08/21/10 174 lb (78.926 kg)  02/02/10 174 lb (78.926 kg)   Constitutional: She is well-developed and well-nourished. Moderate emotional distress.   Neck: Normal range of motion. Neck supple. No JVD present. No thyromegaly present.  Cardiovascular: Normal rate, regular rhythm and normal heart sounds.  No murmur heard. no BLE edema Pulmonary/Chest: Effort normal and breath sounds normal. No respiratory distress. She has no wheezes. Psyc: mood and affect anxious, occ tearful - but appropriate behavior and good insight - no SI/HI     Lab Results  Component Value Date   WBC 5.6 06/26/2009   HGB 14.0 06/26/2009   HCT 41.3 06/26/2009   PLT 205 06/26/2009   ALT 34 06/26/2009   AST 34 06/26/2009   NA 139 06/26/2009   K 4.1 06/26/2009   CL 102  06/26/2009   CREATININE 0.79 06/26/2009   BUN 8 06/26/2009   CO2 31 06/26/2009   INR 0.94 06/26/2009     Assessment & Plan:  See problem list. Medications and labs reviewed today.

## 2011-03-22 ENCOUNTER — Encounter: Payer: Self-pay | Admitting: Internal Medicine

## 2011-03-22 ENCOUNTER — Ambulatory Visit (INDEPENDENT_AMBULATORY_CARE_PROVIDER_SITE_OTHER): Payer: 59 | Admitting: Internal Medicine

## 2011-03-22 VITALS — BP 122/72 | HR 80 | Temp 98.0°F | Ht 63.0 in

## 2011-03-22 DIAGNOSIS — F411 Generalized anxiety disorder: Secondary | ICD-10-CM

## 2011-03-22 DIAGNOSIS — F329 Major depressive disorder, single episode, unspecified: Secondary | ICD-10-CM

## 2011-03-22 DIAGNOSIS — F419 Anxiety disorder, unspecified: Secondary | ICD-10-CM

## 2011-03-22 NOTE — Progress Notes (Signed)
  Subjective:    Patient ID: Rachael Fleming, female    DOB: 10/13/1960, 50 y.o.   MRN: [id number]  Anxiety Presents for follow-up visit. Symptoms include nervous/anxious behavior. Primary symptoms comment: exacerbated by death and dying of parent and work stress     Past Medical History  Diagnosis Date  . DEPRESSION   . GLAUCOMA   . HYPERTENSION   . GERD   . ARTHRITIS     Review of Systems  Neurological: Negative for light-headedness and headaches.  Psychiatric/Behavioral: Positive for dysphoric mood. Negative for hallucinations, behavioral problems and self-injury. The patient is nervous/anxious.        Objective:   Physical Exam BP 122/72  Pulse 80  Temp(Src) 98 F (36.7 C) (Oral)  Ht 5\' 3"  (1.6 m)  SpO2 97% Wt Readings from Last 3 Encounters:  03/13/11 178 lb 6.4 oz (80.922 kg)  08/21/10 174 lb (78.926 kg)  02/02/10 174 lb (78.926 kg)   Constitutional: She appears well-developed and well-nourished. No distress.  Cardiovascular: Normal rate, regular rhythm and normal heart sounds.  No murmur heard. No BLE edema. Pulmonary/Chest: Effort normal and breath sounds normal. No respiratory distress. She has no wheezes.  Psychiatric: She has a tearful and angry/anxious/depressed mood - but appropriate affect. Her behavior is appropriate. Judgment and thought content normal.   Lab Results  Component Value Date   WBC 5.6 06/26/2009   HGB 14.0 06/26/2009   HCT 41.3 06/26/2009   PLT 205 06/26/2009   GLUCOSE 91 06/26/2009   ALT 34 06/26/2009   AST 34 06/26/2009   NA 139 06/26/2009   K 4.1 06/26/2009   CL 102 06/26/2009   CREATININE 0.79 06/26/2009   BUN 8 06/26/2009   CO2 31 06/26/2009   INR 0.94 06/26/2009      Assessment & Plan:  See problem list. Medications and labs reviewed today. Time spent with pt today 25 minutes, greater than 50% time spent counseling patient on anxiety, depression and medication review.
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
## 2011-03-22 NOTE — Patient Instructions (Signed)
It was good to see you today. continue sertraline for everyday control of anxiety/depression symptoms and use xanax as needed for flare up panic symptoms - Continue counseling with hospice and will also refer to additional counseling therapy for you at [facility] to fax FMLA forms to [place] at [phone] - we will call you once these are complete Please schedule followup in 4-6 weeks to review symptoms, call sooner if problems.

## 2011-03-25 ENCOUNTER — Ambulatory Visit: Payer: 59 | Admitting: Internal Medicine

## 2011-04-10 ENCOUNTER — Ambulatory Visit (INDEPENDENT_AMBULATORY_CARE_PROVIDER_SITE_OTHER): Payer: 59 | Admitting: Internal Medicine

## 2011-04-10 ENCOUNTER — Encounter: Payer: Self-pay | Admitting: Internal Medicine

## 2011-04-10 VITALS — BP 118/82 | HR 76 | Temp 98.2°F

## 2011-04-10 DIAGNOSIS — J209 Acute bronchitis, unspecified: Secondary | ICD-10-CM

## 2011-04-10 MED ORDER — AZITHROMYCIN 250 MG PO TABS: ORAL_TABLET | ORAL | Status: AC

## 2011-04-10 MED ORDER — HYDROCODONE-HOMATROPINE 5-1.5 MG/5ML PO SYRP: 5.0000 mL | ORAL_SOLUTION | Freq: Four times a day (QID) | ORAL | Status: AC | PRN

## 2011-04-10 NOTE — Progress Notes (Signed)
  Subjective:    Patient ID: Rachael Fleming, female    DOB: November 12, 1960, 50 y.o.   MRN: [id number]  HPI complains of cough, ?bronchitis Precipitated by sick contacts at work symptoms onset 8 days ago, progressive worsening No fever or shortness of breath or sore throat  No headache, mild myalgia symptoms not improved with OTC medications  Past Medical History  Diagnosis Date  . DEPRESSION   . GLAUCOMA   . HYPERTENSION   . GERD   . ARTHRITIS     Review of Systems  Constitutional: Negative for fever and fatigue.  Respiratory: Negative for shortness of breath and wheezing.   Cardiovascular: Negative for chest pain and palpitations.       Objective:   Physical Exam BP 118/82  Pulse 76  Temp(Src) 98.2 F (36.8 C) (Oral)  SpO2 99%  Constitutional: She appears well-developed and well-nourished. No distress.  HENT: Head: Normocephalic and atraumatic. Ears: B TMs ok, no erythema or effusion; Nose: Nose normal.  Mouth/Throat: Oropharynx is mildly red but clear and moist. No oropharyngeal exudate.  Eyes: Conjunctivae and EOM are normal. Pupils are equal, round, and reactive to light. No scleral icterus.  Neck: Normal range of motion. Neck supple. No JVD present, mild LAD. No thyromegaly present.  Cardiovascular: Normal rate, regular rhythm and normal heart sounds.  No murmur heard. No BLE edema. Pulmonary/Chest: Effort normal and breath sounds bilateral rhonchi. No respiratory distress. She has no wheezes.      Assessment & Plan:  Bronchitis, cough > 1 week

## 2011-04-10 NOTE — Patient Instructions (Signed)
It was good to see you today. Z-Pak antibiotics and Hydromet cough syrup - Your prescription(s) have been submitted to your pharmacy. Please take as directed and contact our office if you believe you are having problem(s) with the medication(s). During the day also use Robitussin or Mucinex for cough symptoms, Tylenol or Advil as needed for aches Stay hydrated and rest as you're able Call if symptoms worse or unimproved

## 2011-04-18 ENCOUNTER — Other Ambulatory Visit: Payer: Self-pay | Admitting: *Deleted

## 2011-04-18 DIAGNOSIS — F419 Anxiety disorder, unspecified: Secondary | ICD-10-CM

## 2011-04-18 MED ORDER — ALPRAZOLAM 0.5 MG PO TABS: 0.5000 mg | ORAL_TABLET | Freq: Three times a day (TID) | ORAL | Status: DC | PRN

## 2011-04-18 NOTE — Telephone Encounter (Signed)
Faxed script back to [facility] @ [phone]....04/18/11@1 :37pm/LMB

## 2011-04-23 ENCOUNTER — Ambulatory Visit: Payer: 59 | Admitting: Internal Medicine

## 2011-04-23 DIAGNOSIS — Z0289 Encounter for other administrative examinations: Secondary | ICD-10-CM

## 2011-05-10 ENCOUNTER — Ambulatory Visit (INDEPENDENT_AMBULATORY_CARE_PROVIDER_SITE_OTHER): Payer: 59 | Admitting: Internal Medicine

## 2011-05-10 ENCOUNTER — Encounter: Payer: Self-pay | Admitting: Internal Medicine

## 2011-05-10 DIAGNOSIS — F329 Major depressive disorder, single episode, unspecified: Secondary | ICD-10-CM

## 2011-05-10 DIAGNOSIS — F411 Generalized anxiety disorder: Secondary | ICD-10-CM

## 2011-05-10 DIAGNOSIS — F419 Anxiety disorder, unspecified: Secondary | ICD-10-CM

## 2011-05-10 MED ORDER — ALPRAZOLAM 0.5 MG PO TABS: 0.5000 mg | ORAL_TABLET | Freq: Three times a day (TID) | ORAL | Status: DC | PRN

## 2011-05-10 MED ORDER — SERTRALINE HCL 25 MG PO TABS: 25.0000 mg | ORAL_TABLET | Freq: Every day | ORAL | Status: DC

## 2011-05-10 MED ORDER — PROMETHAZINE HCL 25 MG PO TABS: 25.0000 mg | ORAL_TABLET | Freq: Four times a day (QID) | ORAL | Status: DC | PRN

## 2011-05-10 NOTE — Progress Notes (Signed)
  Subjective:    Patient ID: Rachael Fleming, female    DOB: 29-May-1960, 50 y.o.   MRN: [id number]  Anxiety Presents for follow-up visit. Symptoms include nervous/anxious behavior. Primary symptoms comment: exacerbated by death of parent and work stress     Past Medical History  Diagnosis Date  . DEPRESSION   . GLAUCOMA   . HYPERTENSION   . GERD   . ARTHRITIS     Review of Systems  Neurological: Negative for light-headedness and headaches.  Psychiatric/Behavioral: Positive for dysphoric mood. Negative for hallucinations, behavioral problems and self-injury. The patient is nervous/anxious.        Objective:   Physical Exam  BP 120/90  Pulse 81  Temp(Src) 98.7 F (37.1 C) (Oral)  SpO2 98% Wt Readings from Last 3 Encounters:  03/13/11 178 lb 6.4 oz (80.922 kg)  08/21/10 174 lb (78.926 kg)  02/02/10 174 lb (78.926 kg)   Constitutional: She appears well-developed and well-nourished. No distress. spouse at side Cardiovascular: Normal rate, regular rhythm and normal heart sounds.  No murmur heard. No BLE edema. Pulmonary/Chest: Effort normal and breath sounds normal. No respiratory distress. She has no wheezes.  Psychiatric: She has a tearful and angry/anxious/depressed mood - but appropriate affect. Her behavior is appropriate. Judgment and thought content normal.   Lab Results  Component Value Date   WBC 5.6 06/26/2009   HGB 14.0 06/26/2009   HCT 41.3 06/26/2009   PLT 205 06/26/2009   GLUCOSE 91 06/26/2009   ALT 34 06/26/2009   AST 34 06/26/2009   NA 139 06/26/2009   K 4.1 06/26/2009   CL 102 06/26/2009   CREATININE 0.79 06/26/2009   BUN 8 06/26/2009   CO2 31 06/26/2009   INR 0.94 06/26/2009      Assessment & Plan:  See problem list. Medications and labs reviewed today. Time spent with pt today 25 minutes, greater than 50% time spent counseling patient on anxiety, depression and medication review.

## 2011-05-10 NOTE — Assessment & Plan Note (Signed)
History of same (with death of mother) but off medication for last few years Current symptoms precipitated by illness/death her father (bone cancer stage IV, death May 30, 2023) sisters death 11-May-2023 and son MVA 06-03-2023; also marital stress Already enrolled in counseling with hospice - recommended additional behavioral health support - Started sertraline 02/2011 - also prn Xanax - refill same The current medical regimen is generally effective considering the circumstances;  continue present plan and medications.  Denies SI - 03/2011 completed FMLA for same - Followup 12 weeks to review meds and symptoms, patient call sooner if worse

## 2011-05-10 NOTE — Patient Instructions (Signed)
It was good to see you today. continue sertraline for everyday control of anxiety/depression symptoms and use xanax as needed for flare up panic symptoms - Refill on medication(s) as discussed today.  Continue counseling with hospice  Please schedule followup in 9-12 weeks to review symptoms, call sooner if problems.

## 2011-07-01 ENCOUNTER — Ambulatory Visit (INDEPENDENT_AMBULATORY_CARE_PROVIDER_SITE_OTHER): Payer: 59 | Admitting: Internal Medicine

## 2011-07-01 ENCOUNTER — Encounter: Payer: Self-pay | Admitting: Internal Medicine

## 2011-07-01 ENCOUNTER — Ambulatory Visit (INDEPENDENT_AMBULATORY_CARE_PROVIDER_SITE_OTHER): Payer: 59 | Admitting: Physician Assistant

## 2011-07-01 DIAGNOSIS — H53149 Visual discomfort, unspecified: Secondary | ICD-10-CM

## 2011-07-01 DIAGNOSIS — G43009 Migraine without aura, not intractable, without status migrainosus: Secondary | ICD-10-CM

## 2011-07-01 DIAGNOSIS — F411 Generalized anxiety disorder: Secondary | ICD-10-CM

## 2011-07-01 DIAGNOSIS — F329 Major depressive disorder, single episode, unspecified: Secondary | ICD-10-CM

## 2011-07-01 DIAGNOSIS — F419 Anxiety disorder, unspecified: Secondary | ICD-10-CM

## 2011-07-01 MED ORDER — ALPRAZOLAM 0.5 MG PO TABS: 0.5000 mg | ORAL_TABLET | Freq: Three times a day (TID) | ORAL | Status: DC | PRN

## 2011-07-01 MED ORDER — SERTRALINE HCL 50 MG PO TABS: 50.0000 mg | ORAL_TABLET | Freq: Every day | ORAL | Status: DC

## 2011-07-01 MED ORDER — PROMETHAZINE HCL 25 MG/ML IJ SOLN
25.0000 mg | Freq: Four times a day (QID) | INTRAMUSCULAR | Status: DC | PRN
  Administered 2011-07-01: 25 mg via INTRAMUSCULAR

## 2011-07-01 MED ORDER — KETOROLAC TROMETHAMINE 60 MG/2ML IM SOLN
60.0000 mg | Freq: Once | INTRAMUSCULAR | Status: AC
  Administered 2011-07-01: 60 mg via INTRAMUSCULAR

## 2011-07-01 NOTE — Assessment & Plan Note (Signed)
History of same (with death of mother) but off medication for last few years Current symptoms precipitated by illness/death her father (bone cancer stage IV, death May 23, 2011) sisters death 04-May-2011 and son MVA May 27, 2023; also marital stress Ongoing grief support counseling with hospice - recommended additional behavioral health support - [facility] working on finding indiv therapist in addition to marriage counseling  Started sertraline 02/2011, increase dose now - also prn Xanax - refill same stable considering the circumstances;  continue present plan and medications.  Denies SI - 03/2011 completed FMLA for same - now interested in pursuing disability - will refer to psyc to help eval for same Followup 12 weeks to review meds and symptoms, patient call sooner if worse

## 2011-07-01 NOTE — Patient Instructions (Signed)
It was good to see you today. Increase dose sertraline for everyday control of anxiety/depression symptoms and use xanax as needed for flare up panic symptoms - Refill on medication(s) as discussed today.  we'll make referral to psychiatrist for help with your medications and symptoms  . Our office will contact you regarding appointment(s) once made. Continue counseling as ongoing - Additional forms for work leave (beyond [employer]) will need to be approved by mental health specialist Please schedule followup in 12 weeks to review symptoms, call sooner if problems.

## 2011-07-01 NOTE — Patient Instructions (Signed)
Get rest.  Continue your efforts for stress relief.

## 2011-07-01 NOTE — Progress Notes (Signed)
  Subjective:    Patient ID: Rachael Fleming, female    DOB: Nov 15, 1960, 51 y.o.   MRN: [id number]  HPI The patient presents, accompanied by her son, complaining of migraine headache. This headache began today. It is her typical headache without any atypical features. She describes it as a throbbing dull ache on the left side of her head in the temporoparietal region. She has photophobia but not phonophobia. She has nausea. No myalgias, arthralgias or rash.  No dizziness. No weakness or paresthesias.  She states that she believes this headache was brought on by stress. Her sister died this past fall and that her father died in 08-Jun-2023.  Review of Systems As above. Mild diarrhea, otherwise negative.    Objective:   Physical Exam Vital signs are noted. Awake, alert and oriented in no acute distress though obviously uncomfortable when the lights are on. HEENT is unremarkable. Of note, funduscopic exam is normal pupils are equal round and reactive to light and extraocular movements are intact. Neck is supple and nontender without lymphadenopathy or thyromegaly. Heart and lungs are clear. She has normal range of motion of the neck and extremities. Good strength and normal sensation. Deep tendon reflexes are intact.       Assessment & Plan:  Migraine headache.  Toradol plus Phenergan IM here. Her son will drive her home to rest.

## 2011-07-01 NOTE — Progress Notes (Signed)
  Subjective:    Patient ID: Rachael Fleming, female    DOB: May 06, 1961, 51 y.o.   MRN: [id number]  Anxiety Presents for follow-up visit. Symptoms include nervous/anxious behavior and restlessness. Primary symptoms comment: exacerbated by death of parent and work stress, no appetite with weight loss Symptoms occur most days. The severity of symptoms is interfering with daily activities and moderate. The quality of sleep is poor.     Past Medical History  Diagnosis Date  . DEPRESSION   . GLAUCOMA   . HYPERTENSION   . GERD   . ARTHRITIS     Review of Systems  Neurological: Negative for light-headedness and headaches.  Psychiatric/Behavioral: Positive for dysphoric mood. Negative for hallucinations, behavioral problems and self-injury. The patient is nervous/anxious.        Objective:   Physical Exam  BP 120/88  Pulse 82  Temp(Src) 97.4 F (36.3 C) (Oral)  Wt 160 lb 1.9 oz (72.63 kg)  SpO2 98% Wt Readings from Last 3 Encounters:  07/01/11 160 lb 1.9 oz (72.63 kg)  03/13/11 178 lb 6.4 oz (80.922 kg)  08/21/10 174 lb (78.926 kg)   Constitutional: She appears well-developed and well-nourished. Emotional, but no physical distress. Cardiovascular: Normal rate, regular rhythm and normal heart sounds.  No murmur heard. No BLE edema. Pulmonary/Chest: Effort normal and breath sounds normal. No respiratory distress. She has no wheezes.  Psychiatric: She has a tearful and angry/anxious/depressed mood - but appropriate affect. Her behavior is appropriate. Judgment and thought content normal.   Lab Results  Component Value Date   WBC 5.6 06/26/2009   HGB 14.0 06/26/2009   HCT 41.3 06/26/2009   PLT 205 06/26/2009   GLUCOSE 91 06/26/2009   ALT 34 06/26/2009   AST 34 06/26/2009   NA 139 06/26/2009   K 4.1 06/26/2009   CL 102 06/26/2009   CREATININE 0.79 06/26/2009   BUN 8 06/26/2009   CO2 31 06/26/2009   INR 0.94 06/26/2009      Assessment & Plan:  See problem list. Medications and labs reviewed  today.

## 2011-07-10 ENCOUNTER — Telehealth: Payer: Self-pay | Admitting: *Deleted

## 2011-07-10 NOTE — Telephone Encounter (Signed)
Faxed over a behavioral health question & medical information. Checking to see if md has received form. psl call back @ [phone] ext [phone]... 07/10/11@10 :53am/LMB

## 2011-07-10 NOTE — Telephone Encounter (Signed)
I did complete the form medical sections that I was able to provide information about. This was signed and returned to staff who would be responsible for returning to fax. Many of the questions and information will need to be answered by her psychiatrist

## 2011-07-11 NOTE — Telephone Encounter (Signed)
#   that was given is not the correct #. Form was faxed back on Monday... 07/11/11@8 :48am/LMB

## 2011-08-09 ENCOUNTER — Other Ambulatory Visit (INDEPENDENT_AMBULATORY_CARE_PROVIDER_SITE_OTHER): Payer: 59

## 2011-08-09 ENCOUNTER — Encounter: Payer: Self-pay | Admitting: Internal Medicine

## 2011-08-09 ENCOUNTER — Ambulatory Visit (INDEPENDENT_AMBULATORY_CARE_PROVIDER_SITE_OTHER): Payer: 59 | Admitting: Internal Medicine

## 2011-08-09 ENCOUNTER — Telehealth: Payer: Self-pay | Admitting: *Deleted

## 2011-08-09 VITALS — BP 102/78 | HR 69 | Temp 97.6°F | Resp 16 | Wt 169.0 lb

## 2011-08-09 DIAGNOSIS — Z0000 Encounter for general adult medical examination without abnormal findings: Secondary | ICD-10-CM

## 2011-08-09 DIAGNOSIS — F329 Major depressive disorder, single episode, unspecified: Secondary | ICD-10-CM

## 2011-08-09 LAB — CBC WITH DIFFERENTIAL/PLATELET
Basophils Absolute: 0 10*3/uL (ref 0.0–0.1)
Eosinophils Absolute: 0.1 10*3/uL (ref 0.0–0.7)
Hemoglobin: 13.2 g/dL (ref 12.0–15.0)
Lymphs Abs: 1.9 10*3/uL (ref 0.7–4.0)
MCHC: 32.9 g/dL (ref 30.0–36.0)
MCV: 91.4 fl (ref 78.0–100.0)
Monocytes Absolute: 0.4 10*3/uL (ref 0.1–1.0)
Monocytes Relative: 6.7 % (ref 3.0–12.0)
Neutro Abs: 3.1 10*3/uL (ref 1.4–7.7)
Platelets: 231 10*3/uL (ref 150.0–400.0)
RBC: 4.4 Mil/uL (ref 3.87–5.11)
RDW: 16.8 % — ABNORMAL HIGH (ref 11.5–14.6)
WBC: 5.4 10*3/uL (ref 4.5–10.5)

## 2011-08-09 LAB — TSH: TSH: 1.31 u[IU]/mL (ref 0.35–5.50)

## 2011-08-09 LAB — LIPID PANEL
HDL: 86.1 mg/dL (ref >39.00)
Triglycerides: 77 mg/dL (ref 0.0–149.0)
VLDL: 15.4 mg/dL (ref 0.0–40.0)

## 2011-08-09 LAB — BASIC METABOLIC PANEL
Calcium: 9.9 mg/dL (ref 8.4–10.5)
Creatinine, Ser: 0.8 mg/dL (ref 0.4–1.2)
GFR: 93.03 mL/min (ref >60.00)
Sodium: 140 mEq/L (ref 135–145)

## 2011-08-09 LAB — URINALYSIS, ROUTINE W REFLEX MICROSCOPIC
Leukocytes, UA: NEGATIVE
Specific Gravity, Urine: 1.02 (ref 1.000–1.030)
Urobilinogen, UA: 0.2 (ref 0.0–1.0)

## 2011-08-09 LAB — HEPATIC FUNCTION PANEL
ALT: 27 U/L (ref 0–35)
Total Bilirubin: 0.5 mg/dL (ref 0.3–1.2)
Total Protein: 7.6 g/dL (ref 6.0–8.3)

## 2011-08-09 LAB — LDL CHOLESTEROL, DIRECT: Direct LDL: 145.9 mg/dL

## 2011-08-09 MED ORDER — PROMETHAZINE HCL 25 MG PO TABS: 25.0000 mg | ORAL_TABLET | Freq: Four times a day (QID) | ORAL | Status: DC | PRN

## 2011-08-09 MED ORDER — ASPIRIN EC 81 MG PO TBEC: 81.0000 mg | DELAYED_RELEASE_TABLET | Freq: Every day | ORAL | Status: DC

## 2011-08-09 NOTE — Patient Instructions (Signed)
It was good to see you today. Health Maintenance reviewed - labs today, all other recommended are up to date. Test(s) ordered today. Your results will be called to you after review (48-72hours after test completion). If any changes need to be made, you will be notified at that time. Medications reviewed, no changes at this time. Please schedule followup in 6 months for blood pressure and weight check, call sooner if problems.

## 2011-08-09 NOTE — Progress Notes (Signed)
Subjective:    Patient ID: Rachael Fleming, female    DOB: 22-Jul-1960, 51 y.o.   MRN: [id number]  HPI patient is here today for annual physical. Patient feels well overall -  Also reviewed chronic med issues also:  HTN - never on rx meds for same, but "watches" BP - follows low salt diet - no headache or edema.  anxiety/depression - hx same, flare summer 2012-winter 27-Oct-2011 precipitated by marriage trouble and family illness/parent death - following at [facility] since 06/2011, improved with med changes - reports compliance with ongoing medical treatment. denies adverse side effects related to curent therapy.   Chronic migraines. [facility]. She takes daily BC powders despite previous advice to decrease her use of this medication. Headaches have been going on daily for the last 4-5 years, associated with decline in health, then death of her mother in 10-27-07. 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 uses Imitrex as needed   history of anemia in the 90s. She denies any current fatigue. No bright red blood per rectum or melena. no heavy menstruation.  Positive occasional reflux and abdominal pain associated with BC powder use  GERD symptoms. not on regular medication for this. Associated indigestion and heartburn with BC powder use. No known history of ulcers. no recent endoscopy evaluation in the last 5 years   Past Medical History  Diagnosis Date  . DEPRESSION     PTSD complication, working at [employer]  . GLAUCOMA   . HYPERTENSION   . GERD   . ARTHRITIS    Family History  Problem Relation Age of Onset  . Arthritis Mother   . Diabetes Mother   . Dementia Mother   . Stroke Mother   . Arthritis Father   . Prostate cancer Father   . Cancer Father     prostate, metastatic  . Stroke Father   . Alcohol abuse Other   . Diabetes Other   . Stroke Other   . Cancer Sister 64    HTN, EtOH   History  Substance Use Topics    . Smoking status: Never Smoker   . Smokeless tobacco: Not on file   Comment: Graduate of [employer]. employed @ [employer] 3rd shift  . Alcohol Use: Yes     Social     Review of Systems  Constitutional: Negative for fever and unexpected weight change.  Respiratory: Negative for cough and shortness of breath.   Genitourinary: Negative for dysuria.  Neurological: Negative for dizziness, tremors and syncope.   Cardiovascular: Negative for chest pain or palpitations.  Gastrointestinal: Negative for abdominal pain, no bowel changes.  Musculoskeletal: Negative for gait problem or joint swelling.  Skin: Negative for rash.  No other specific complaints in a complete review of systems (except as listed in HPI above).     Objective:   Physical Exam  BP 102/78  Pulse 69  Temp(Src) 97.6 F (36.4 C) (Oral)  Resp 16  Wt 169 lb (76.658 kg)  SpO2 94% Wt Readings from Last 3 Encounters:  08/09/11 169 lb (76.658 kg)  07/01/11 159 lb 3.2 oz (72.213 kg)  07/01/11 160 lb 1.9 oz (72.63 kg)  Constitutional: She is overweight, but appears well-developed and well-nourished. No distress.  HENT: Head: Normocephalic and atraumatic. Ears: B TMs ok, no erythema or effusion; Nose: Nose normal. Mouth/Throat: Oropharynx is clear and moist. No oropharyngeal exudate.  Eyes: wears glasses - Conjunctivae and EOM are normal. Pupils  are equal, round, and reactive to light. No scleral icterus.  Neck: Normal range of motion. Neck supple. No JVD present. No thyromegaly present.  Cardiovascular: Normal rate, regular rhythm and normal heart sounds.  No murmur heard. No BLE edema. Pulmonary/Chest: Effort normal and breath sounds normal. No respiratory distress. She has no wheezes.  Abdominal: Soft. Bowel sounds are normal. She exhibits no distension. There is no tenderness. no masses Musculoskeletal: Normal range of motion, no joint effusions. No gross deformities Neurological: She is alert and oriented to person,  place, and time. No cranial nerve deficit. Coordination normal.  Skin: Skin is warm and dry. No rash noted. No erythema.  Psychiatric: She has a normal mood and affect. Her behavior is normal. Judgment and thought content normal.       Lab Results  Component Value Date   WBC 5.6 06/26/2009   HGB 14.0 06/26/2009   HCT 41.3 06/26/2009   PLT 205 06/26/2009   ALT 34 06/26/2009   AST 34 06/26/2009   NA 139 06/26/2009   K 4.1 06/26/2009   CL 102 06/26/2009   CREATININE 0.79 06/26/2009   BUN 8 06/26/2009   CO2 31 06/26/2009   INR 0.94 06/26/2009     Assessment & Plan:   CPX - v70.0 - Patient has been counseled on age-appropriate routine health concerns for screening and prevention. These are reviewed and up-to-date. Immunizations are up-to-date or declined. Labs ordered and will be reviewed.

## 2011-08-09 NOTE — Telephone Encounter (Signed)
Rx Done per VO Dr Leschber/SLS

## 2011-08-09 NOTE — Assessment & Plan Note (Signed)
History of same (with death of mother 2006/11/11) but off medication until 11/11/2010 recurrent symptoms precipitated by illness/death her father (bone cancer stage IV, death 2011/05/28) sisters death 2011-05-09 and son MVA 2023/06/01; also marital stress Ongoing grief support counseling with hospice and additional behavioral health support at [facility]  Started sertraline 02/2011, increased 06/2010 pscy at [facility] has adjusted meds - reviewed in depth today;  continue present plan and medications.  Denies SI - 03/2011 completed FMLA for same - psyc at [facility] ([facility]) managing ?disability and return to work

## 2011-08-12 ENCOUNTER — Ambulatory Visit (INDEPENDENT_AMBULATORY_CARE_PROVIDER_SITE_OTHER): Payer: 59 | Admitting: Family Medicine

## 2011-08-12 DIAGNOSIS — G43009 Migraine without aura, not intractable, without status migrainosus: Secondary | ICD-10-CM

## 2011-08-12 MED ORDER — PROMETHAZINE HCL 25 MG/ML IJ SOLN
25.0000 mg | Freq: Once | INTRAMUSCULAR | Status: AC
  Administered 2011-08-12: 25 mg via INTRAMUSCULAR

## 2011-08-12 MED ORDER — KETOROLAC TROMETHAMINE 60 MG/2ML IM SOLN
60.0000 mg | Freq: Once | INTRAMUSCULAR | Status: AC
  Administered 2011-08-12: 60 mg via INTRAMUSCULAR

## 2011-08-12 NOTE — Progress Notes (Signed)
  Subjective:    Patient ID: Rachael Fleming, female    DOB: 01-20-1961, 51 y.o.   MRN: [id number]  HPI 51 yo female with h/o migraines.  Here with migraine today associated with nausea.  Started this morning.  [facility] and [facility].  Have been helping.  Feels like usual migraine.  Right sided, pounding.  Has driver with her.     Review of Systems Negative except as per HPI     Objective:   Physical Exam  Constitutional: She is oriented to person, place, and time. She appears well-developed.  HENT:  Head: Normocephalic.  Eyes: Conjunctivae and EOM are normal. Pupils are equal, round, and reactive to light.  Pulmonary/Chest: Effort normal.  Neurological: She is alert and oriented to person, place, and time.  Skin: Skin is warm.          Assessment & Plan:  Migraine - toradol and phenergan here.

## 2011-09-11 ENCOUNTER — Other Ambulatory Visit: Payer: Self-pay | Admitting: *Deleted

## 2011-09-11 DIAGNOSIS — G4700 Insomnia, unspecified: Secondary | ICD-10-CM

## 2011-09-11 MED ORDER — AMITRIPTYLINE HCL 10 MG PO TABS: 10.0000 mg | ORAL_TABLET | Freq: Every day | ORAL | Status: DC

## 2011-09-11 NOTE — Telephone Encounter (Signed)
Pt is requesting refill on amitriptyline. Med was last filled on 12/09/10. Is this ok to refill?... 09/11/11@2 :54pm/LMB

## 2011-11-20 ENCOUNTER — Ambulatory Visit (INDEPENDENT_AMBULATORY_CARE_PROVIDER_SITE_OTHER): Payer: 59 | Admitting: Family Medicine

## 2011-11-20 VITALS — BP 136/88 | HR 107 | Temp 97.8°F | Resp 16 | Ht 61.75 in | Wt 179.0 lb

## 2011-11-20 DIAGNOSIS — R110 Nausea: Secondary | ICD-10-CM

## 2011-11-20 DIAGNOSIS — G43909 Migraine, unspecified, not intractable, without status migrainosus: Secondary | ICD-10-CM

## 2011-11-20 MED ORDER — PROMETHAZINE HCL 25 MG PO TABS: 25.0000 mg | ORAL_TABLET | Freq: Three times a day (TID) | ORAL | Status: DC | PRN

## 2011-11-20 MED ORDER — KETOPROFEN 50 MG PO CAPS: 50.0000 mg | ORAL_CAPSULE | Freq: Four times a day (QID) | ORAL | Status: AC | PRN

## 2011-11-20 MED ORDER — KETOROLAC TROMETHAMINE 60 MG/2ML IM SOLN
60.0000 mg | Freq: Once | INTRAMUSCULAR | Status: AC
  Administered 2011-11-20: 60 mg via INTRAMUSCULAR

## 2011-11-20 MED ORDER — ONDANSETRON HCL 4 MG PO TABS
4.0000 mg | ORAL_TABLET | Freq: Once | ORAL | Status: AC
  Administered 2011-11-20: 4 mg via ORAL

## 2011-11-20 NOTE — Patient Instructions (Addendum)

## 2011-11-20 NOTE — Progress Notes (Signed)
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  Has two sons who have had medical problems lately which has been stressful.  She developed a headache about 11 am today which is typical for migraine.  It is located left frontotemporal and is associated with nausea and some photophobia  Objective:  NAD HEENT:  Unremarkable Fundi:  Negative, EOM normal, PERLA Neuro:  Intact Chest: clear Heart:  Reg, no murmur  A:  Migraine  P: toradol 60 mg IM Phenergan 25 mg

## 2012-01-08 ENCOUNTER — Emergency Department (HOSPITAL_COMMUNITY): Payer: 59

## 2012-01-08 ENCOUNTER — Ambulatory Visit (INDEPENDENT_AMBULATORY_CARE_PROVIDER_SITE_OTHER): Payer: 59 | Admitting: Emergency Medicine

## 2012-01-08 ENCOUNTER — Emergency Department (HOSPITAL_COMMUNITY)
Admission: EM | Admit: 2012-01-08 | Discharge: 2012-01-08 | Disposition: A | Discharge status: Home / Self Care | Payer: 59 | Attending: Emergency Medicine | Admitting: Emergency Medicine

## 2012-01-08 ENCOUNTER — Encounter (HOSPITAL_COMMUNITY): Payer: Self-pay | Admitting: *Deleted

## 2012-01-08 VITALS — BP 112/80 | HR 98 | Temp 98.5°F | Resp 16 | Ht 61.75 in | Wt 188.4 lb

## 2012-01-08 DIAGNOSIS — F3289 Other specified depressive episodes: Secondary | ICD-10-CM | POA: Insufficient documentation

## 2012-01-08 DIAGNOSIS — F329 Major depressive disorder, single episode, unspecified: Secondary | ICD-10-CM | POA: Insufficient documentation

## 2012-01-08 DIAGNOSIS — R42 Dizziness and giddiness: Secondary | ICD-10-CM

## 2012-01-08 DIAGNOSIS — I10 Essential (primary) hypertension: Secondary | ICD-10-CM | POA: Insufficient documentation

## 2012-01-08 DIAGNOSIS — R4020 Unspecified coma: Secondary | ICD-10-CM

## 2012-01-08 DIAGNOSIS — R110 Nausea: Secondary | ICD-10-CM

## 2012-01-08 DIAGNOSIS — R51 Headache: Secondary | ICD-10-CM | POA: Insufficient documentation

## 2012-01-08 DIAGNOSIS — M542 Cervicalgia: Secondary | ICD-10-CM

## 2012-01-08 DIAGNOSIS — H409 Unspecified glaucoma: Secondary | ICD-10-CM | POA: Insufficient documentation

## 2012-01-08 DIAGNOSIS — Z79899 Other long term (current) drug therapy: Secondary | ICD-10-CM | POA: Insufficient documentation

## 2012-01-08 DIAGNOSIS — K219 Gastro-esophageal reflux disease without esophagitis: Secondary | ICD-10-CM | POA: Insufficient documentation

## 2012-01-08 HISTORY — DX: Migraine, unspecified, not intractable, without status migrainosus: G43.909

## 2012-01-08 MED ORDER — FENTANYL CITRATE 0.05 MG/ML IJ SOLN
50.0000 ug | Freq: Once | INTRAMUSCULAR | Status: AC
  Administered 2012-01-08: 50 ug via INTRAVENOUS
  Filled 2012-01-08 (×2): qty 2

## 2012-01-08 MED ORDER — SODIUM CHLORIDE 0.9 % IV BOLUS (SEPSIS)
1000.0000 mL | Freq: Once | INTRAVENOUS | Status: AC
  Administered 2012-01-08: 1000 mL via INTRAVENOUS

## 2012-01-08 MED ORDER — HYDROCODONE-ACETAMINOPHEN 5-325 MG PO TABS: 2.0000 | ORAL_TABLET | ORAL | Status: AC | PRN

## 2012-01-08 MED ORDER — PROMETHAZINE HCL 25 MG/ML IJ SOLN
12.5000 mg | Freq: Once | INTRAMUSCULAR | Status: AC
  Administered 2012-01-08: 12.5 mg via INTRAVENOUS
  Filled 2012-01-08: qty 1

## 2012-01-08 MED ORDER — IBUPROFEN 600 MG PO TABS: 600.0000 mg | ORAL_TABLET | Freq: Four times a day (QID) | ORAL | Status: AC | PRN

## 2012-01-08 MED ORDER — DROPERIDOL 2.5 MG/ML IJ SOLN
2.5000 mg | Freq: Once | INTRAMUSCULAR | Status: DC
  Filled 2012-01-08: qty 1

## 2012-01-08 MED ORDER — KETOROLAC TROMETHAMINE 30 MG/ML IJ SOLN
30.0000 mg | Freq: Once | INTRAMUSCULAR | Status: AC
  Administered 2012-01-08: 30 mg via INTRAVENOUS
  Filled 2012-01-08: qty 1

## 2012-01-08 NOTE — ED Notes (Signed)
Pt was swimming ;aps at the [facility] and says she felt a "pop" in the back of her head and had a migraine.  Pt went to PCP and they called [facility] for transport advised she needed a CT. Pt now c/o H/A 8/10.

## 2012-01-08 NOTE — ED Notes (Signed)
Pt ambulatory to [facility] without problems. 

## 2012-01-08 NOTE — ED Provider Notes (Addendum)
History     CSN: [id number]  Arrival date & time 01/08/12  1159   First MD Initiated Contact with Patient 01/08/12 1230      Chief Complaint  Patient presents with  . Migraine      HPI Pt was swimming ;aps at the [facility] and says she felt a "pop" in the back of her head and had a migraine. Pt went to PCP and they called [facility] for transport advised she needed a CT. Pt now c/o H/A 8/10.  Past Medical History  Diagnosis Date  . DEPRESSION     PTSD complication, working at [employer]  . GLAUCOMA   . HYPERTENSION   . GERD   . ARTHRITIS   . Migraine     Past Surgical History  Procedure Date  . Cesarean section 1990  . Bilateral breast reduction 1998  . Skin graft 2006  . Bladder tack 2008  . Abdominal hysterectomy 2008  . L3-5 decompression 06/2009    Family History  Problem Relation Age of Onset  . Arthritis Mother   . Diabetes Mother   . Dementia Mother   . Stroke Mother   . Arthritis Father   . Prostate cancer Father   . Cancer Father     prostate, metastatic  . Stroke Father   . Alcohol abuse Other   . Diabetes Other   . Stroke Mother   . Cancer Sister 37    HTN, EtOH  . Hypertension Mother   . Hypertension Father     History  Substance Use Topics  . Smoking status: Never Smoker   . Smokeless tobacco: Not on file   Comment: Graduate of [employer]. employed @ [employer] 3rd shift  . Alcohol Use: Yes     Social    OB History    Grav Para Term Preterm Abortions TAB SAB Ect Mult Living                  Review of Systems  All other systems reviewed and are negative.    Allergies  Penicillins  Home Medications   Current Outpatient Rx  Name Route Sig Dispense Refill  . ALPRAZOLAM ER 3 MG PO TB24 Oral Take 3 mg by mouth daily.    Marland Kitchen AMITRIPTYLINE HCL 10 MG PO TABS Oral Take 1 tablet (10 mg total) by mouth at bedtime. 30 tablet 3  . ASPIRIN EC 81 MG PO TBEC Oral Take 1 tablet (81 mg total) by mouth daily. 150 tablet 2  . BIOTIN PO Oral Take 1  tablet by mouth daily.     Marland Kitchen ENJUVIA 0.45 MG PO TABS Oral Take 0.45 mg by mouth daily.     . MULTIVITAMIN & MINERAL PO Oral Take by mouth daily. Alive 50+ MVI.    Marland Kitchen VILAZODONE HCL 40 MG PO TABS Oral Take 40 mg by mouth daily.    Marland Kitchen HYDROCODONE-ACETAMINOPHEN 5-325 MG PO TABS Oral Take 2 tablets by mouth every 4 (four) hours as needed for pain. 10 tablet 0  . IBUPROFEN 600 MG PO TABS Oral Take 1 tablet (600 mg total) by mouth every 6 (six) hours as needed for pain. 30 tablet 0  . PROMETHAZINE HCL 25 MG PO TABS Oral Take 1 tablet (25 mg total) by mouth every 8 (eight) hours as needed for nausea. 20 tablet 0    BP 151/86  Pulse 62  Temp 97.9 F (36.6 C) (Oral)  Resp 17  Ht 5\' 2"  (1.575 m)  Wt 185 lb (83.915 kg)  BMI 33.84 kg/m2  SpO2 100%  Physical Exam  Nursing note and vitals reviewed. Constitutional: She is oriented to person, place, and time. She appears well-developed. No distress.  HENT:  Head: Normocephalic and atraumatic.  Eyes: Pupils are equal, round, and reactive to light.  Neck: Normal range of motion.    Cardiovascular: Normal rate and intact distal pulses.   Pulmonary/Chest: No respiratory distress.  Abdominal: Normal appearance. She exhibits no distension.  Musculoskeletal: Normal range of motion.  Neurological: She is alert and oriented to person, place, and time. No cranial nerve deficit. GCS eye subscore is 4. GCS verbal subscore is 5. GCS motor subscore is 6.  Skin: Skin is warm and dry. No rash noted.  Psychiatric: She has a normal mood and affect. Her behavior is normal.    ED Course  Procedures (including critical care time) Scheduled Meds:   . fentaNYL  50 mcg Intravenous Once  . ketorolac  30 mg Intravenous Once  . promethazine  12.5 mg Intravenous Once  . sodium chloride  1,000 mL Intravenous Once  . DISCONTD: droperidol  2.5 mg Intravenous Once   Continuous Infusions:  PRN Meds:.promethazine  Labs Reviewed - No data to display Ct Head Wo  Contrast  01/08/2012  *RADIOLOGY REPORT*  Clinical Data: Headache  CT HEAD WITHOUT CONTRAST  Technique:  Contiguous axial images were obtained from the base of the skull through the vertex without contrast.  Comparison: Brain MRI 02/06/2009  Findings: No acute hemorrhage, acute infarction, or mass lesion is identified.  No midline shift.  No ventriculomegaly.  No skull fracture.  Orbits and paranasal sinuses are intact.  IMPRESSION: Normal head CT.   Original Report Authenticated By: Harrel Lemon, M.D.      1. Cervical pain       MDM  Her pain can be clearly reproduced by palpation at the base of the occiput.       Nelia Shi, MD 01/08/12 1526  Nelia Shi, MD 01/17/12 1150

## 2012-01-08 NOTE — Progress Notes (Signed)
  Subjective:    Patient ID: Rachael Fleming, female    DOB: 05-12-1961, 51 y.o.   MRN: [id number]  HPI  Patient was swimming and reaching for the wall  And felt like something exploded in her head.  Felt like a sledge hammer hit her in the back of head.  Does not smoke but does have anxiety and ptsd.      Review of Systems     Objective:   Physical Exam pupils equal and reactive to light  Dtr 2+ Motor 5 out of 5 Sensory exam normal prefers to lay down due to headache        Assessment & Plan:  Patient placed on 02 and transferred to hospital for evaluation

## 2012-02-14 ENCOUNTER — Encounter: Payer: Self-pay | Admitting: Internal Medicine

## 2012-02-14 ENCOUNTER — Ambulatory Visit (INDEPENDENT_AMBULATORY_CARE_PROVIDER_SITE_OTHER): Payer: 59 | Admitting: Internal Medicine

## 2012-02-14 VITALS — BP 132/82 | HR 66 | Temp 97.4°F | Ht 61.5 in | Wt 195.1 lb

## 2012-02-14 DIAGNOSIS — F329 Major depressive disorder, single episode, unspecified: Secondary | ICD-10-CM

## 2012-02-14 DIAGNOSIS — Z23 Encounter for immunization: Secondary | ICD-10-CM

## 2012-02-14 DIAGNOSIS — I10 Essential (primary) hypertension: Secondary | ICD-10-CM

## 2012-02-14 DIAGNOSIS — E669 Obesity, unspecified: Secondary | ICD-10-CM

## 2012-02-14 NOTE — Patient Instructions (Signed)
It was good to see you today. Medications reviewed and updated, no changes at this time. We have reviewed your prior records including labs and tests today Work on lifestyle changes as discussed (low fat, low carb, increased protein diet; improved exercise efforts; weight loss) to control sugar, blood pressure and cholesterol levels and/or reduce risk of developing other medical problems. Look into [email or web address] or other type of food journal to assist you in this process. Please schedule followup in 6 months for labs and review, call sooner if problems.  Exercise to Lose Weight Exercise and a healthy diet may help you lose weight. Your doctor may suggest specific exercises. EXERCISE IDEAS AND TIPS  Choose low-cost things you enjoy doing, such as walking, bicycling, or exercising to workout videos.   Take stairs instead of the elevator.   Walk during your lunch break.   Park your car further away from work or school.   Go to a gym or an exercise class.   Start with 5 to 10 minutes of exercise each day. Build up to 30 minutes of exercise 4 to 6 days a week.   Wear shoes with good support and comfortable clothes.   Stretch before and after working out.   Work out until you breathe harder and your heart beats faster.   Drink extra water when you exercise.   Do not do so much that you hurt yourself, feel dizzy, or get very short of breath.  Exercises that burn about 150 calories:  Running 1  miles in 15 minutes.   Playing volleyball for 45 to 60 minutes.   Washing and waxing a car for 45 to 60 minutes.   Playing touch football for 45 minutes.   Walking 1  miles in 35 minutes.   Pushing a stroller 1  miles in 30 minutes.   Playing basketball for 30 minutes.   Raking leaves for 30 minutes.   Bicycling 5 miles in 30 minutes.   Walking 2 miles in 30 minutes.   Dancing for 30 minutes.   Shoveling snow for 15 minutes.   Swimming laps for 20 minutes.   Walking up  stairs for 15 minutes.   Bicycling 4 miles in 15 minutes.   Gardening for 30 to 45 minutes.   Jumping rope for 15 minutes.   Washing windows or floors for 45 to 60 minutes.  Document Released: 06/08/2010 Document Revised: 04/25/2011 Document Reviewed: 06/08/2010 [facility] Patient Information 2012 [place], [place].Calorie Counting Diet A calorie counting diet requires you to eat the number of calories that are right for you in a day. Calories are the measurement of how much energy you get from the food you eat. Eating the right amount of calories is important for staying at a healthy weight. If you eat too many calories, your body will store them as fat and you may gain weight. If you eat too few calories, you may lose weight. Counting the number of calories you eat during a day will help you know if you are eating the right amount. A Registered Dietitian can determine how many calories you need in a day. The amount of calories needed varies from person to person. If your goal is to lose weight, you will need to eat fewer calories. Losing weight can benefit you if you are overweight or have health problems such as heart disease, high blood pressure, or diabetes. If your goal is to gain weight, you will need to eat more calories. Gaining weight may be  necessary if you have a certain health problem that causes your body to need more energy. TIPS Whether you are increasing or decreasing the number of calories you eat during a day, it may be hard to get used to changes in what you eat and drink. The following are tips to help you keep track of the number of calories you eat.  Measure foods at home with measuring cups. This helps you know the amount of food and number of calories you are eating.   Restaurants often serve food in amounts that are larger than 1 serving. While eating out, estimate how many servings of a food you are given. For example, a serving of cooked rice is  cup or about the size of  half of a fist. Knowing serving sizes will help you be aware of how much food you are eating at restaurants.   Ask for smaller portion sizes or child-size portions at restaurants.   Plan to eat half of a meal at a restaurant. Take the rest home or share the other half with a friend.   Read the Nutrition Facts panel on food labels for calorie content and serving size. You can find out how many servings are in a package, the size of a serving, and the number of calories each serving has.   For example, a package might contain 3 cookies. The Nutrition Facts panel on that package says that 1 serving is 1 cookie. Below that, it will say there are 3 servings in the container. The calories section of the Nutrition Facts label says there are 90 calories. This means there are 90 calories in 1 cookie (1 serving). If you eat 1 cookie you have eaten 90 calories. If you eat all 3 cookies, you have eaten 270 calories (3 servings x 90 calories = 270 calories).  The list below tells you how big or small some common portion sizes are.  1 oz.........4 stacked dice.   3 oz........Marland KitchenDeck of cards.   1 tsp.......Marland KitchenTip of little finger.   1 tbs......Marland KitchenMarland KitchenThumb.   2 tbs.......Marland KitchenGolf ball.    cup......Marland KitchenHalf of a fist.   1 cup.......Marland KitchenA fist.  KEEP A FOOD LOG Write down every food item you eat, the amount you eat, and the number of calories in each food you eat during the day. At the end of the day, you can add up the total number of calories you have eaten. It may help to keep a list like the one below. Find out the calorie information by reading the Nutrition Facts panel on food labels. Breakfast  Bran cereal (1 cup, 110 calories).   Fat-free milk ( cup, 45 calories).  Snack  Apple (1 medium, 80 calories).  Lunch  Spinach (1 cup, 20 calories).   Tomato ( medium, 20 calories).   Chicken breast strips (3 oz, 165 calories).   Shredded cheddar cheese ( cup, 110 calories).   Light [place] dressing (2  tbs, 60 calories).   Whole-wheat bread (1 slice, 80 calories).   Tub margarine (1 tsp, 35 calories).   Vegetable soup (1 cup, 160 calories).  Dinner  Pork chop (3 oz, 190 calories).   Brown rice (1 cup, 215 calories).   Steamed broccoli ( cup, 20 calories).   Strawberries (1  cup, 65 calories).   Whipped cream (1 tbs, 50 calories).  Daily Calorie Total: 1425 Document Released: 05/06/2005 Document Revised: 04/25/2011 Document Reviewed: 10/31/2006 [facility] Patient Information 2012 [place], [place].

## 2012-02-14 NOTE — Assessment & Plan Note (Signed)
BP Readings from Last 3 Encounters:  02/14/12 132/82  01/08/12 147/89  01/08/12 112/80   No med treatment at this time Monitor and The patient is asked to make an attempt to improve diet and exercise patterns to aid in medical management of this problem.

## 2012-02-14 NOTE — Assessment & Plan Note (Signed)
Wt Readings from Last 3 Encounters:  02/14/12 195 lb 2 oz (88.508 kg)  01/08/12 185 lb (83.915 kg)  01/08/12 188 lb 6.4 oz (85.458 kg)   The patient is asked to make an attempt to improve diet and exercise patterns to aid in medical management of this problem.

## 2012-02-14 NOTE — Progress Notes (Signed)
  Subjective:    Patient ID: Rachael Fleming, female    DOB: 1960-07-19, 51 y.o.   MRN: [id number]  HPI  Here for follow up   Past Medical History  Diagnosis Date  . DEPRESSION     PTSD complication, working at [employer]  . GLAUCOMA   . HYPERTENSION   . GERD   . ARTHRITIS   . Migraine     Review of Systems  Respiratory: Negative for cough and shortness of breath.   Cardiovascular: Negative for chest pain and palpitations.  Neurological: Negative for light-headedness and headaches.  Psychiatric/Behavioral: Positive for dysphoric mood. Negative for hallucinations, behavioral problems, self-injury and decreased concentration. The patient is not nervous/anxious.        Objective:   Physical Exam  BP 132/82  Pulse 66  Temp 97.4 F (36.3 C) (Oral)  Ht 5' 1.5" (1.562 m)  Wt 195 lb 2 oz (88.508 kg)  BMI 36.27 kg/m2  SpO2 96% Wt Readings from Last 3 Encounters:  02/14/12 195 lb 2 oz (88.508 kg)  01/08/12 185 lb (83.915 kg)  01/08/12 188 lb 6.4 oz (85.458 kg)   Constitutional: She is overweight, but appears well-developed and well-nourished.  Cardiovascular: Normal rate, regular rhythm and normal heart sounds.  No murmur heard. No BLE edema. Pulmonary/Chest: Effort normal and breath sounds normal. No respiratory distress. She has no wheezes.  Psychiatric: She has appropriate affect. Her behavior is appropriate. Judgment and thought content normal.   Lab Results  Component Value Date   WBC 5.4 08/09/2011   HGB 13.2 08/09/2011   HCT 40.2 08/09/2011   PLT 231.0 08/09/2011   GLUCOSE 60* 08/09/2011   CHOL 241* 08/09/2011   TRIG 77.0 08/09/2011   HDL 86.10 08/09/2011   LDLDIRECT 145.9 08/09/2011   ALT 27 08/09/2011   AST 29 08/09/2011   NA 140 08/09/2011   K 5.0 08/09/2011   CL 103 08/09/2011   CREATININE 0.8 08/09/2011   BUN 13 08/09/2011   CO2 31 08/09/2011   TSH 1.31 08/09/2011   INR 0.94 06/26/2009   Ct Head Wo Contrast  01/08/2012  *RADIOLOGY REPORT*  Clinical Data:  Headache  CT HEAD WITHOUT CONTRAST  Technique:  Contiguous axial images were obtained from the base of the skull through the vertex without contrast.  Comparison: Brain MRI 02/06/2009  Findings: No acute hemorrhage, acute infarction, or mass lesion is identified.  No midline shift.  No ventriculomegaly.  No skull fracture.  Orbits and paranasal sinuses are intact.  IMPRESSION: Normal head CT.   Original Report Authenticated By: Harrel Lemon, M.D.        Assessment & Plan:  See problem list. Medications and labs reviewed today.  Time spent with pt today 25 minutes, greater than 50% time spent counseling patient on weight and risk of diabetes, depression and medication review. Also review of prior records
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
## 2012-03-10 DIAGNOSIS — Z0279 Encounter for issue of other medical certificate: Secondary | ICD-10-CM

## 2012-04-06 ENCOUNTER — Ambulatory Visit (INDEPENDENT_AMBULATORY_CARE_PROVIDER_SITE_OTHER): Payer: 59 | Admitting: Family Medicine

## 2012-04-06 VITALS — BP 134/90 | HR 68 | Temp 97.5°F | Resp 16 | Ht 62.0 in | Wt 194.0 lb

## 2012-04-06 DIAGNOSIS — G43909 Migraine, unspecified, not intractable, without status migrainosus: Secondary | ICD-10-CM

## 2012-04-06 DIAGNOSIS — R110 Nausea: Secondary | ICD-10-CM

## 2012-04-06 MED ORDER — PROMETHAZINE HCL 25 MG PO TABS
25.0000 mg | ORAL_TABLET | Freq: Three times a day (TID) | ORAL | Status: DC | PRN
Start: 2012-04-06 — End: 2012-08-19

## 2012-04-06 MED ORDER — BUTALBITAL-APAP-CAFFEINE 50-325-40 MG PO TABS: 1.0000 | ORAL_TABLET | Freq: Four times a day (QID) | ORAL | Status: DC | PRN

## 2012-04-06 MED ORDER — KETOROLAC TROMETHAMINE 60 MG/2ML IM SOLN
60.0000 mg | Freq: Once | INTRAMUSCULAR | Status: AC
  Administered 2012-04-06: 60 mg via INTRAMUSCULAR

## 2012-04-06 MED ORDER — PROMETHAZINE HCL 25 MG/ML IJ SOLN
25.0000 mg | Freq: Once | INTRAMUSCULAR | Status: AC
  Administered 2012-04-06: 25 mg via INTRAMUSCULAR

## 2012-04-06 NOTE — Progress Notes (Signed)
[facility]:  Office Visit  Chief Complaint:  Chief Complaint  Patient presents with  . Migraine    had trigger injections Friday- has had HA the entire weekend    HPI: Morelia R Groover is a 51 y.o. female who complains of  "pounding on right side, behind right eye" x 4 days, has nausea, no emesis. No fevers, chills, + light sensitivity , no diplopia.  No numbness, tingling associated with HA. No noise sensitivity. 7.5/10 HA since Friday. Has tried goody's BC powder without relief. Has had toradol and phenergan with good results.    Past Medical History  Diagnosis Date  . DEPRESSION     PTSD complication, working at [employer]  . GLAUCOMA   . HYPERTENSION   . GERD   . ARTHRITIS   . Migraine   . Anxiety    Past Surgical History  Procedure Date  . Cesarean section 1990  . Bilateral breast reduction 1998  . Skin graft 2006  . Bladder tack 2008  . Abdominal hysterectomy 2008  . L3-5 decompression 06/2009  . Breast surgery   . Spine surgery    History   Social History  . Marital Status: Married    Spouse Name: N/A    Number of Children: N/A  . Years of Education: N/A   Social History Main Topics  . Smoking status: Never Smoker   . Smokeless tobacco: None     Comment: Graduate of [employer] college. employed @ [employer] 3rd shift  . Alcohol Use: Yes     Comment: Social  . Drug Use: No  . Sexually Active: None   Other Topics Concern  . None   Social History Narrative  . None   Family History  Problem Relation Age of Onset  . Arthritis Mother   . Diabetes Mother   . Dementia Mother   . Stroke Mother   . Hypertension Mother   . Arthritis Father   . Prostate cancer Father   . Cancer Father     prostate, metastatic  . Stroke Father   . Hypertension Father   . Alcohol abuse Other   . Diabetes Other   . Cancer Sister 29    HTN, EtOH  . Diabetes Maternal Grandmother   . Cancer Maternal Grandfather   . Cancer Paternal Grandmother   .  Cancer Paternal Grandfather    Allergies  Allergen Reactions  . Penicillins     REACTION: Nausea \\T \ vomitting   Prior to Admission medications   Medication Sig Start Date End Date Taking? Authorizing Provider  ALPRAZolam (XANAX XR) 3 MG 24 hr tablet Take 3 mg by mouth daily.   Yes Historical Provider, MD  aspirin EC 81 MG tablet Take 1 tablet (81 mg total) by mouth daily. 08/09/11 08/08/12 Yes Newt Lukes, MD  ENJUVIA 0.45 MG tablet Take 0.45 mg by mouth daily.  02/17/11  Yes Historical Provider, MD  gabapentin (NEURONTIN) 300 MG capsule Take 300 mg by mouth at bedtime.   Yes Historical Provider, MD  Multiple Vitamins-Minerals (MULTIVITAMIN & MINERAL PO) Take by mouth daily. Alive 50+ MVI.   Yes Historical Provider, MD  traZODone (DESYREL) 50 MG tablet Take 50 mg by mouth at bedtime.   Yes Historical Provider, MD  Vilazodone HCl (VIIBRYD) 40 MG TABS Take 40 mg by mouth daily.   Yes Historical Provider, MD  meloxicam (MOBIC) 15 MG tablet Take 15 mg by mouth daily.    Historical Provider, MD  promethazine (PHENERGAN) 25 MG tablet Take 1 tablet (25 mg total) by mouth every 8 (eight) hours as needed for nausea. 11/20/11 11/27/11  Elvina Sidle, MD     ROS: The patient denies fevers, chills, night sweats, unintentional weight loss, chest pain, palpitations, wheezing, dyspnea on exertion,  vomiting, abdominal pain, dysuria, hematuria, melena, numbness, weakness, or tingling. All other systems have been reviewed and were otherwise negative with the exception of those mentioned in the HPI and as above.    PHYSICAL EXAM: Filed Vitals:   04/06/12 1557  BP: 134/90  Pulse: 68  Temp: 97.5 F (36.4 C)  Resp: 16   Filed Vitals:   04/06/12 1557  Height: 5\' 2"  (1.575 m)  Weight: 194 lb (87.998 kg)   Body mass index is 35.48 kg/(m^2).  General: Alert, no acute distress HEENT:  Normocephalic, atraumatic, oropharynx patent. PERRLA, EOMI, fundoscopic exam nl, min photophobia Cardiovascular:   Regular rate and rhythm, no rubs murmurs or gallops.  No Carotid bruits, radial pulse intact. No pedal edema.  Respiratory: Clear to auscultation bilaterally.  No wheezes, rales, or rhonchi.  No cyanosis, no use of accessory musculature GI: No organomegaly, abdomen is soft and non-tender, positive bowel sounds.  No masses. Skin: No rashes. Neurologic: Facial musculature symmetric. Psychiatric: Patient is appropriate throughout our interaction. Lymphatic: No cervical lymphadenopathy Musculoskeletal: Gait intact.   LABS: Results for orders placed in visit on 08/09/11  TSH      Component Value Range   TSH 1.31  0.35 - 5.50 uIU/mL  LIPID PANEL      Component Value Range   Cholesterol 241 (*) 0 - 200 mg/dL   Triglycerides 16.1  0.0 - 149.0 mg/dL   HDL 09.60  >45.40 mg/dL   VLDL 98.1  0.0 - 19.1 mg/dL   Total CHOL/HDL Ratio 3    HEPATIC FUNCTION PANEL      Component Value Range   Total Bilirubin 0.5  0.3 - 1.2 mg/dL   Bilirubin, Direct 0.1  0.0 - 0.3 mg/dL   Alkaline Phosphatase 80  39 - 117 U/L   AST 29  0 - 37 U/L   ALT 27  0 - 35 U/L   Total Protein 7.6  6.0 - 8.3 g/dL   Albumin 3.9  3.5 - 5.2 g/dL  CBC WITH DIFFERENTIAL      Component Value Range   WBC 5.4  4.5 - 10.5 K/uL   RBC 4.40  3.87 - 5.11 Mil/uL   Hemoglobin 13.2  12.0 - 15.0 g/dL   HCT 47.8  29.5 - 62.1 %   MCV 91.4  78.0 - 100.0 fl   MCHC 32.9  30.0 - 36.0 g/dL   RDW 30.8 (*) 65.7 - 84.6 %   Platelets 231.0  150.0 - 400.0 K/uL   Neutrophils Relative 56.6  43.0 - 77.0 %   Lymphocytes Relative 34.6  12.0 - 46.0 %   Monocytes Relative 6.7  3.0 - 12.0 %   Eosinophils Relative 1.8  0.0 - 5.0 %   Basophils Relative 0.3  0.0 - 3.0 %   Neutro Abs 3.1  1.4 - 7.7 K/uL   Lymphs Abs 1.9  0.7 - 4.0 K/uL   Monocytes Absolute 0.4  0.1 - 1.0 K/uL   Eosinophils Absolute 0.1  0.0 - 0.7 K/uL   Basophils Absolute 0.0  0.0 - 0.1 K/uL  BASIC METABOLIC PANEL      Component Value Range   Sodium 140  135 - 145 mEq/L  Potassium  5.0  3.5 - 5.1 mEq/L   Chloride 103  96 - 112 mEq/L   CO2 31  19 - 32 mEq/L   Glucose, Bld 60 (*) 70 - 99 mg/dL   BUN 13  6 - 23 mg/dL   Creatinine, Ser 0.8  0.4 - 1.2 mg/dL   Calcium 9.9  8.4 - 11.9 mg/dL   GFR 14.78  >29.56 mL/min  URINALYSIS, ROUTINE W REFLEX MICROSCOPIC      Component Value Range   Color, Urine LT. YELLOW  Yellow;Lt. Yellow   APPearance CLEAR  Clear   Specific Gravity, Urine 1.020  1.000-1.030   pH 5.5  5.0 - 8.0   Total Protein, Urine NEGATIVE  Negative   Urine Glucose NEGATIVE  Negative   Ketones, ur NEGATIVE  Negative   Bilirubin Urine NEGATIVE  Negative   Hgb urine dipstick SMALL  Negative   Urobilinogen, UA 0.2  0.0 - 1.0   Leukocytes, UA NEGATIVE  Negative   Nitrite NEGATIVE  Negative   Squamous Epithelial / LPF Rare(0-4/hpf)  Rare(0-4/hpf)  LDL CHOLESTEROL, DIRECT      Component Value Range   Direct LDL 145.9       EKG/XRAY:   Primary read interpreted by Dr. Conley Rolls at [facility].   ASSESSMENT/PLAN: Encounter Diagnoses  Name Primary?  . Migraine Yes  . Nausea    Gave toradol 60 mg & phenergan 25 mg in house Rx Phenergan and also fioricet given Son is here with mom and will drive her hme Work note given If no improvement or worsening sxs then f/u prn or go to ER    Mickell Birdwell PHUONG, DO 04/07/2012 12:23 PM

## 2012-07-06 ENCOUNTER — Encounter: Payer: Self-pay | Admitting: Certified Nurse Midwife

## 2012-08-14 ENCOUNTER — Ambulatory Visit: Payer: 59 | Admitting: Internal Medicine

## 2012-08-14 DIAGNOSIS — Z0289 Encounter for other administrative examinations: Secondary | ICD-10-CM

## 2012-08-18 ENCOUNTER — Ambulatory Visit: Payer: 59 | Admitting: Certified Nurse Midwife

## 2012-08-19 ENCOUNTER — Ambulatory Visit (INDEPENDENT_AMBULATORY_CARE_PROVIDER_SITE_OTHER): Payer: 59 | Admitting: Certified Nurse Midwife

## 2012-08-19 ENCOUNTER — Encounter: Payer: Self-pay | Admitting: Certified Nurse Midwife

## 2012-08-19 VITALS — BP 110/74 | Ht 62.0 in | Wt 199.0 lb

## 2012-08-19 DIAGNOSIS — N952 Postmenopausal atrophic vaginitis: Secondary | ICD-10-CM

## 2012-08-19 DIAGNOSIS — Z8639 Personal history of other endocrine, nutritional and metabolic disease: Secondary | ICD-10-CM

## 2012-08-19 DIAGNOSIS — Z01419 Encounter for gynecological examination (general) (routine) without abnormal findings: Secondary | ICD-10-CM

## 2012-08-19 DIAGNOSIS — Z0000 Encounter for general adult medical examination without abnormal findings: Secondary | ICD-10-CM

## 2012-08-19 LAB — POCT URINALYSIS DIPSTICK
Glucose, UA: NEGATIVE
Ketones, UA: NEGATIVE
Leukocytes, UA: NEGATIVE
Protein, UA: NEGATIVE
Urobilinogen, UA: NEGATIVE

## 2012-08-19 MED ORDER — ESTROGENS, CONJUGATED 0.625 MG/GM VA CREA: 0.5000 g | TOPICAL_CREAM | VAGINAL | Status: DC

## 2012-08-19 NOTE — Progress Notes (Signed)
52 y.o. MarriedAfrican American female   [phone] here for annual exam.  No health issues today.  Has gained weight this past winter, now working on weight lifting again. No vaginal dryness, Premarin cream working well.  Sees PCP for aex, labs and medication management. Stopped ERT, felt she did not need it anymore. Doing well no hot flashes that are a problem.  Patient's last menstrual period was 12/18/2005.          Sexually active: yes  The current method of family planning is status post hysterectomy.    Exercising: yes  tae kwon do Last mammogram: 2012 normal Last pap: 06/13/08 neg Last BMD: none Alcohol: 2 a week Tobacco: none   Health Maintenance  Topic Date Due  . Mammogram  08/30/2012  . Influenza Vaccine  01/18/2013  . Tetanus/tdap  05/20/2013  . Pap Smear  08/19/2013  . Colonoscopy  10/12/2018    Family History  Problem Relation Age of Onset  . Arthritis Mother   . Diabetes Mother   . Dementia Mother   . Stroke Mother   . Hypertension Mother   . Arthritis Father   . Cancer Father     prostate, metastatic turned into bone  . Stroke Father   . Hypertension Father   . Alcohol abuse Sister     committed suicide  . Diabetes Maternal Grandmother   . Cancer Maternal Grandfather   . Cancer Paternal Grandmother   . Cancer Paternal Grandfather     Patient Active Problem List  Diagnosis  . DEPRESSION  . GLAUCOMA  . HYPERTENSION  . GERD  . CONSTIPATION  . ARTHRITIS  . HEADACHE  . Insomnia  . Obese    Past Medical History  Diagnosis Date  . DEPRESSION     PTSD complication, working at [employer]  . GLAUCOMA   . HYPERTENSION   . GERD   . ARTHRITIS   . Migraine   . Anxiety   . Abnormal Pap smear 1984    Cryo    Past Surgical History  Procedure Laterality Date  . Cesarean section  1990  . Bilateral breast reduction  1998  . Skin graft  2006  . Bladder tack  2008  . Abdominal hysterectomy  2008    [facility]  . L3-5 decompression  06/2009  . Breast  surgery    . Spine surgery    . Tubal ligation  1/90  . Cervix lesion destruction  1984  . Shoulder surgery Right     Allergies: Penicillins  Current Outpatient Prescriptions  Medication Sig Dispense Refill  . ALPRAZolam (XANAX XR) 3 MG 24 hr tablet Take 3 mg by mouth daily.      . ARIPiprazole (ABILIFY) 2 MG tablet Take 2 mg by mouth daily.      Marland Kitchen aspirin EC 81 MG tablet Take 81 mg by mouth daily.      . celecoxib (CELEBREX) 200 MG capsule Take 200 mg by mouth daily.      Marland Kitchen conjugated estrogens (PREMARIN) vaginal cream Place 0.5 g vaginally 2 (two) times a week.      . gabapentin (NEURONTIN) 300 MG capsule Take 300 mg by mouth 3 (three) times daily.       . Multiple Vitamins-Minerals (MULTIVITAMIN & MINERAL PO) Take by mouth daily. Alive 50+ MVI.      Marland Kitchen traZODone (DESYREL) 50 MG tablet Take 100 mg by mouth at bedtime. 100-200mg       . Vilazodone HCl (VIIBRYD) 40 MG  TABS Take by mouth daily.       No current facility-administered medications for this visit.    ROS: Pertinent items are noted in HPI.  Exam:    BP 110/74  Ht 5\' 2"  (1.575 m)  Wt 199 lb (90.266 kg)  BMI 36.39 kg/m2  LMP 12/18/2005 Weight change: @WEIGHTCHANGE @ Last 3 height recordings:  Ht Readings from Last 3 Encounters:  08/19/12 5\' 2"  (1.575 m)  04/06/12 5\' 2"  (1.575 m)  02/14/12 5' 1.5" (1.562 m)   General appearance: alert and cooperative Head: Normocephalic, without obvious abnormality, atraumatic Neck: no adenopathy, supple, symmetrical, trachea midline and thyroid not enlarged, symmetric, no tenderness/mass/nodules Lungs: clear to auscultation bilaterally Breasts: normal appearance, no masses or tenderness, Inspection negative, No nipple retraction or dimpling, No nipple discharge or bleeding Heart: regular rate and rhythm Abdomen: soft, non-tender; bowel sounds normal; no masses,  no organomegaly Extremities: extremities normal, atraumatic, no cyanosis or edema Skin: Skin color, texture, turgor  normal. No rashes or lesions numerous tattoos Lymph nodes: Cervical, supraclavicular, and axillary nodes normal. no inguinal nodes palpated Neurologic: Alert and oriented X 3, normal strength and tone. Normal symmetric reflexes. Normal coordination and gait   Pelvic: External genitalia:  no lesions              Urethra: normal appearing urethra with no masses, tenderness or lesions              Bartholins and Skenes: normal, Bartholin's, Urethra, Skene's normal                 Vagina: normal appearing vagina with normal color and discharge, no lesions              Cervix: absent              Pap taken: no        Bimanual Exam:  Uterus:  absent                                      Adnexa:    not indicated and no masses                                      Rectovaginal: Confirms                                      Anus:  normal sphincter tone, no lesions  A: well woman Menopausal no ERT now patient choice History of PTSD on stable medication History of Vitamin D deficiency Atrophic vaginitis Premarin cream working well     P:Reviewed health and wellness pertinent to exam  mammogram stressed yearly Continue MD follow up Lab Vitamin D Rx Premarin cream see orders  return annually or prn      An After Visit Summary was printed and given to the patient.   Reviewed, TL

## 2012-08-19 NOTE — Patient Instructions (Signed)

## 2012-08-19 NOTE — Progress Notes (Deleted)
52 y.o. Married{Race/ethnicity:17218} female   [id number] here for annual exam.     Patient's last menstrual period was 12/18/2005.          Sexually active: {yes no:314532}  The current method of family planning is {contraception:315051}.    Exercising: {yes no:314532}  {types:19826} Last mammogram: Last pap: Last BMD:  Alcohol: Tobacco:   Health Maintenance  Topic Date Due  . Mammogram  08/30/2012  . Influenza Vaccine  01/18/2013  . Tetanus/tdap  05/20/2013  . Pap Smear  08/19/2013  . Colonoscopy  10/12/2018    Family History  Problem Relation Age of Onset  . Arthritis Mother   . Diabetes Mother   . Dementia Mother   . Stroke Mother   . Hypertension Mother   . Arthritis Father   . Prostate cancer Father   . Cancer Father     prostate, metastatic  . Stroke Father   . Hypertension Father   . Alcohol abuse Other   . Diabetes Other   . Cancer Sister 26    HTN, EtOH  . Diabetes Maternal Grandmother   . Cancer Maternal Grandfather   . Cancer Paternal Grandmother   . Cancer Paternal Grandfather     Patient Active Problem List  Diagnosis  . DEPRESSION  . GLAUCOMA  . HYPERTENSION  . GERD  . CONSTIPATION  . ARTHRITIS  . HEADACHE  . Insomnia  . Obese    Past Medical History  Diagnosis Date  . DEPRESSION     PTSD complication, working at [employer]  . GLAUCOMA   . HYPERTENSION   . GERD   . ARTHRITIS   . Migraine   . Anxiety   . Abnormal Pap smear 1984    Cryo    Past Surgical History  Procedure Laterality Date  . Cesarean section  1990  . Bilateral breast reduction  1998  . Skin graft  2006  . Bladder tack  2008  . Abdominal hysterectomy  2008    TVH  . L3-5 decompression  06/2009  . Breast surgery    . Spine surgery    . Tubal ligation  1/90  . Cervix lesion destruction  1984    Allergies: Penicillins  Current Outpatient Prescriptions  Medication Sig Dispense Refill  . ALPRAZolam (XANAX XR) 3 MG 24 hr tablet Take 3 mg by mouth daily.       . ARIPiprazole (ABILIFY) 2 MG tablet Take 2 mg by mouth daily.      Marland Kitchen aspirin EC 81 MG tablet Take 81 mg by mouth daily.      . celecoxib (CELEBREX) 200 MG capsule Take 200 mg by mouth daily.      Marland Kitchen conjugated estrogens (PREMARIN) vaginal cream Place 0.5 g vaginally 2 (two) times a week.      . gabapentin (NEURONTIN) 300 MG capsule Take 300 mg by mouth 3 (three) times daily.       . Multiple Vitamins-Minerals (MULTIVITAMIN & MINERAL PO) Take by mouth daily. Alive 50+ MVI.      Marland Kitchen traZODone (DESYREL) 50 MG tablet Take 100 mg by mouth at bedtime. 100-200mg       . Vilazodone HCl (VIIBRYD) 40 MG TABS Take by mouth daily.       No current facility-administered medications for this visit.    ROS: {Ros - complete:30496}  Exam:    BP 110/74  Ht 5\' 2"  (1.575 m)  Wt 199 lb (90.266 kg)  BMI 36.39 kg/m2  LMP 12/18/2005 Weight change: @  WEIGHTCHANGE@ Last 3 height recordings:  Ht Readings from Last 3 Encounters:  08/19/12 5\' 2"  (1.575 m)  04/06/12 5\' 2"  (1.575 m)  02/14/12 5' 1.5" (1.562 m)   General appearance: {general exam:16600} Head: {head exam:30909::"Normocephalic, without obvious abnormality","atraumatic"} Neck: {neck exam:17463::"no adenopathy","no carotid bruit","no JVD","supple, symmetrical, trachea midline","thyroid not enlarged, symmetric, no tenderness/mass/nodules"} Lungs: {lung exam:16931} Breasts: {breast exam:13139::"normal appearance, no masses or tenderness"} Heart: {heart exam:5510} Abdomen: {abdominal exam:16834} Extremities: {extremity exam:5109} Skin: {skin exam:31329::"Skin color, texture, turgor normal. No rashes or lesions"} Lymph nodes: {lymph node exam:14039::"Cervical, supraclavicular, and axillary nodes normal."} {Exam; lymph nodes inguinal:30852} Neurologic: {neuro exam:17854}   Pelvic: External genitalia:  {Exam; genitalia female:32129}              Urethra: {urethra:311719::"not indicated"}              Bartholins and Skenes: {EXAM; GYN ZOXWR:60454}                  Vagina: {vagina:315903::"normal appearing vagina with normal color and discharge, no lesions"}              Cervix: {exam; gyn cervix:30847}              Pap taken: {yes no:314532}        Bimanual Exam:  Uterus:  {uterus:315905::"uterus is normal size, shape, consistency and nontender"}                                      Adnexa:    {adnexa:311645::"not indicated"}                                      Rectovaginal: {Rectovaginal:16320}                                      Anus:  {Exam; anus:16940}  A: {Gyn assessment:5268::"well woman"}     P: {plan; gyn:5269::"mammogram","pap smear","return annually or prn"}      An After Visit Summary was printed and given to the patient.

## 2012-08-20 LAB — VITAMIN D 25 HYDROXY (VIT D DEFICIENCY, FRACTURES): Vit D, 25-Hydroxy: 19 ng/mL — ABNORMAL LOW (ref 30–89)

## 2012-08-21 LAB — HEMOGLOBIN, FINGERSTICK: Hemoglobin, fingerstick: 13.5 g/dL (ref 12.0–16.0)

## 2012-09-14 ENCOUNTER — Telehealth: Payer: Self-pay | Admitting: Certified Nurse Midwife

## 2012-09-14 MED ORDER — VITAMIN D (ERGOCALCIFEROL) 1.25 MG (50000 UNIT) PO CAPS
50000.0000 [IU] | ORAL_CAPSULE | ORAL | Status: DC
Start: 2012-09-14 — End: 2012-11-24

## 2012-09-14 NOTE — Telephone Encounter (Signed)
Left message for callback. See result note for lab results

## 2012-09-14 NOTE — Telephone Encounter (Signed)
Patient returning call and letter re: results,

## 2012-09-14 NOTE — Telephone Encounter (Signed)
Patient notified of abnormal Vitamin D and wants script sent to [facility] on [address] and [address]. Medication sent as requested by patient she is aware to follow up for 8 week lab.

## 2012-09-26 ENCOUNTER — Ambulatory Visit (INDEPENDENT_AMBULATORY_CARE_PROVIDER_SITE_OTHER): Payer: 59 | Admitting: Family Medicine

## 2012-09-26 VITALS — BP 111/76 | HR 98 | Temp 98.0°F | Resp 16 | Ht 62.0 in | Wt 201.4 lb

## 2012-09-26 DIAGNOSIS — G43901 Migraine, unspecified, not intractable, with status migrainosus: Secondary | ICD-10-CM

## 2012-09-26 MED ORDER — PROMETHAZINE HCL 25 MG/ML IJ SOLN
25.0000 mg | Freq: Once | INTRAMUSCULAR | Status: AC
  Administered 2012-09-26: 25 mg via INTRAMUSCULAR

## 2012-09-26 MED ORDER — KETOROLAC TROMETHAMINE 60 MG/2ML IM SOLN
60.0000 mg | Freq: Once | INTRAMUSCULAR | Status: AC
  Administered 2012-09-26: 60 mg via INTRAMUSCULAR

## 2012-09-26 NOTE — Patient Instructions (Addendum)
Migraine Headache A migraine headache is an intense, throbbing pain on one or both sides of your head. A migraine can last for 30 minutes to several hours. CAUSES  The exact cause of a migraine headache is not always known. However, a migraine may be caused when nerves in the brain become irritated and release chemicals that cause inflammation. This causes pain. SYMPTOMS  Pain on one or both sides of your head.  Pulsating or throbbing pain.  Severe pain that prevents daily activities.  Pain that is aggravated by any physical activity.  Nausea, vomiting, or both.  Dizziness.  Pain with exposure to bright lights, loud noises, or activity.  General sensitivity to bright lights, loud noises, or smells. Before you get a migraine, you may get warning signs that a migraine is coming (aura). An aura may include:  Seeing flashing lights.  Seeing bright spots, halos, or zig-zag lines.  Having tunnel vision or blurred vision.  Having feelings of numbness or tingling.  Having trouble talking.  Having muscle weakness. MIGRAINE TRIGGERS  Alcohol.  Smoking.  Stress.  Menstruation.  Aged cheeses.  Foods or drinks that contain nitrates, glutamate, aspartame, or tyramine.  Lack of sleep.  Chocolate.  Caffeine.  Hunger.  Physical exertion.  Fatigue.  Medicines used to treat chest pain (nitroglycerine), birth control pills, estrogen, and some blood pressure medicines. DIAGNOSIS  A migraine headache is often diagnosed based on:  Symptoms.  Physical examination.  A CT scan or MRI of your head. TREATMENT Medicines may be given for pain and nausea. Medicines can also be given to help prevent recurrent migraines.  HOME CARE INSTRUCTIONS  Only take over-the-counter or prescription medicines for pain or discomfort as directed by your caregiver. The use of long-term narcotics is not recommended.  Lie down in a dark, quiet room when you have a migraine.  Keep a journal  to find out what may trigger your migraine headaches. For example, write down:  What you eat and drink.  How much sleep you get.  Any change to your diet or medicines.  Limit alcohol consumption.  Quit smoking if you smoke.  Get 7 to 9 hours of sleep, or as recommended by your caregiver.  Limit stress.  Keep lights dim if bright lights bother you and make your migraines worse. SEEK IMMEDIATE MEDICAL CARE IF:   Your migraine becomes severe.  You have a fever.  You have a stiff neck.  You have vision loss.  You have muscular weakness or loss of muscle control.  You start losing your balance or have trouble walking.  You feel faint or pass out.  You have severe symptoms that are different from your first symptoms. MAKE SURE YOU:   Understand these instructions.  Will watch your condition.  Will get help right away if you are not doing well or get worse. Document Released: 05/06/2005 Document Revised: 07/29/2011 Document Reviewed: 04/26/2011 ExitCare Patient Information 2013 [employer].  

## 2012-09-26 NOTE — Progress Notes (Signed)
  Subjective:    Patient ID: Rachael Fleming, female    DOB: [birth year], 52 y.o.   MRN: [id number]  HPI  This has lasted 2d but not going away.  Normally takes [facility] powders (even though she was told not to).  Goes to [facility] - Dr. Neale Burly - who gives her shots that ward it off for a while.  He took her off everything which normally is fine but then doesn't have anything when they creep up.  Tried ice pack and sleeping w/o result, tried working it out.  This is otherwise a very typical migraine for her The fioricet normally does work and she still has some of that at home.   Review of Systems    BP 111/76  Pulse 98  Temp(Src) 98 F (36.7 C) (Oral)  Resp 16  Ht 5\' 2"  (1.575 m)  Wt 201 lb 6.4 oz (91.354 kg)  BMI 36.83 kg/m2  SpO2 98%  LMP 12/18/2005 Objective:   Physical Exam        Assessment & Plan:

## 2012-11-24 ENCOUNTER — Emergency Department (HOSPITAL_COMMUNITY): Payer: 59

## 2012-11-24 ENCOUNTER — Emergency Department (HOSPITAL_COMMUNITY)
Admission: EM | Admit: 2012-11-24 | Discharge: 2012-11-24 | Disposition: A | Discharge status: Home / Self Care | Payer: 59 | Attending: Emergency Medicine | Admitting: Emergency Medicine

## 2012-11-24 ENCOUNTER — Telehealth: Payer: Self-pay | Admitting: Internal Medicine

## 2012-11-24 ENCOUNTER — Encounter (HOSPITAL_COMMUNITY): Payer: Self-pay | Admitting: *Deleted

## 2012-11-24 DIAGNOSIS — G43909 Migraine, unspecified, not intractable, without status migrainosus: Secondary | ICD-10-CM | POA: Insufficient documentation

## 2012-11-24 DIAGNOSIS — F3289 Other specified depressive episodes: Secondary | ICD-10-CM | POA: Insufficient documentation

## 2012-11-24 DIAGNOSIS — R002 Palpitations: Secondary | ICD-10-CM | POA: Insufficient documentation

## 2012-11-24 DIAGNOSIS — I10 Essential (primary) hypertension: Secondary | ICD-10-CM | POA: Insufficient documentation

## 2012-11-24 DIAGNOSIS — M129 Arthropathy, unspecified: Secondary | ICD-10-CM | POA: Insufficient documentation

## 2012-11-24 DIAGNOSIS — R110 Nausea: Secondary | ICD-10-CM | POA: Insufficient documentation

## 2012-11-24 DIAGNOSIS — Z7982 Long term (current) use of aspirin: Secondary | ICD-10-CM | POA: Insufficient documentation

## 2012-11-24 DIAGNOSIS — R51 Headache: Secondary | ICD-10-CM | POA: Insufficient documentation

## 2012-11-24 DIAGNOSIS — Z791 Long term (current) use of non-steroidal anti-inflammatories (NSAID): Secondary | ICD-10-CM | POA: Insufficient documentation

## 2012-11-24 DIAGNOSIS — Z880 Allergy status to penicillin: Secondary | ICD-10-CM | POA: Insufficient documentation

## 2012-11-24 DIAGNOSIS — F411 Generalized anxiety disorder: Secondary | ICD-10-CM | POA: Insufficient documentation

## 2012-11-24 DIAGNOSIS — Z8719 Personal history of other diseases of the digestive system: Secondary | ICD-10-CM | POA: Insufficient documentation

## 2012-11-24 DIAGNOSIS — Z8669 Personal history of other diseases of the nervous system and sense organs: Secondary | ICD-10-CM | POA: Insufficient documentation

## 2012-11-24 DIAGNOSIS — F329 Major depressive disorder, single episode, unspecified: Secondary | ICD-10-CM | POA: Insufficient documentation

## 2012-11-24 DIAGNOSIS — Z79899 Other long term (current) drug therapy: Secondary | ICD-10-CM | POA: Insufficient documentation

## 2012-11-24 LAB — BASIC METABOLIC PANEL
Calcium: 9.8 mg/dL (ref 8.4–10.5)
GFR calc non Af Amer: 74 mL/min — ABNORMAL LOW (ref >90)
Glucose, Bld: 99 mg/dL (ref 70–99)
Potassium: 4.1 mEq/L (ref 3.5–5.1)
Sodium: 141 mEq/L (ref 135–145)

## 2012-11-24 LAB — CBC
Hemoglobin: 12.1 g/dL (ref 12.0–15.0)
MCH: 27.9 pg (ref 26.0–34.0)
MCHC: 32.7 g/dL (ref 30.0–36.0)
Platelets: 234 10*3/uL (ref 150–400)
RBC: 4.34 MIL/uL (ref 3.87–5.11)

## 2012-11-24 LAB — POCT I-STAT TROPONIN I: Troponin i, poc: 0.01 ng/mL (ref 0.00–0.08)

## 2012-11-24 MED ORDER — PROPRANOLOL HCL 10 MG PO TABS: 10.0000 mg | ORAL_TABLET | Freq: Two times a day (BID) | ORAL | Status: DC

## 2012-11-24 MED ORDER — PROMETHAZINE HCL 25 MG/ML IJ SOLN
25.0000 mg | Freq: Once | INTRAMUSCULAR | Status: AC
  Administered 2012-11-24: 25 mg via INTRAMUSCULAR
  Filled 2012-11-24 (×2): qty 1

## 2012-11-24 MED ORDER — KETOROLAC TROMETHAMINE 30 MG/ML IJ SOLN
30.0000 mg | Freq: Once | INTRAMUSCULAR | Status: AC
  Administered 2012-11-24: 30 mg via INTRAMUSCULAR
  Filled 2012-11-24: qty 1

## 2012-11-24 NOTE — Telephone Encounter (Signed)
Patient Information:  Caller Name: Rachael Fleming  Phone: [phone]  Patient: Rachael, Fleming  Gender: Female  DOB: 1960-12-30  Age: 52 Years  PCP: Rachael Fleming (Adults only)  Pregnant: No  Office Follow Up:  Does the office need to follow up with this patient?: No  Instructions For The Office: N/A  RN Note:  Having palpitations since awoke at 0900 (for past 20 minutes.)  Denies shortness of breath or feeling lightheaded. Pulse 100 bpm and irregular. Chest "squeezing" present now.  Symptoms  Reason For Call & Symptoms: Emergent Call: Intermittent episodes of heart "fluttering; like a fish flopping around" for past week.  Reviewed Health History In EMR: Yes  Reviewed Medications In EMR: Yes  Reviewed Allergies In EMR: Yes  Reviewed Surgeries / Procedures: Yes  Date of Onset of Symptoms: 11/17/2012  Treatments Tried: antacids  Treatments Tried Worked: No OB / GYN:  LMP: Unknown  Guideline(s) Used:  Heart Rate and Heartbeat Questions  Chest Pain  Disposition Per Guideline:   Call EMS 911 Now  Reason For Disposition Reached:   Chest pain lasting longer than 5 minutes and ANY of the following:  Over 29 years old Over 60 years old and at least one cardiac risk factor (i.e., high blood pressure, diabetes, high cholesterol, obesity, smoker or strong family history of heart disease) Pain is crushing, pressure-like, or heavy  Took nitroglycerin and chest pain was not relieved History of heart disease (i.e., angina, heart attack, bypass surgery, angioplasty, CHF)  Advice Given:  N/A  Patient Will Follow Care Advice:  YES

## 2012-11-24 NOTE — ED Notes (Signed)
Pt is here with fluttering in her chest to left side like a fish fluttering around for the last week.  No sob but states some chest pressure to the left and pulsation to left thigh.  Pain is more with exertion

## 2012-11-24 NOTE — ED Provider Notes (Signed)
History    CSN: [id number] Arrival date & time 11/24/12  0945  First MD Initiated Contact with Patient 11/24/12 1011     No chief complaint on file.  (Consider location/radiation/quality/duration/timing/severity/associated sxs/prior Treatment) HPI Comments: Patient is a 52 year old woman who says she has fluttering in her chest like a dead fish flopping. Increases when she walks. Sometimes the pain goes down into her legs. This is been going on for a week or more intermittently. She called her primary care physician, Rene Paci M.D., and the office advised her to go to the ED for evaluation. There's no history of any heart disease.  Patient is a 52 y.o. female presenting with palpitations. The history is provided by the patient. No language interpreter was used.  Palpitations Palpitations quality: "Like a fish flopping in the chest," pointing to the precordial region. Onset quality:  Gradual Duration: Has brief episodes like this. Timing:  Intermittent Progression:  Waxing and waning Chronicity:  New Context: caffeine   Relieved by:  Nothing Worsened by:  Nothing tried Ineffective treatments:  None tried Associated symptoms: nausea   Associated symptoms: no vomiting    Past Medical History  Diagnosis Date  . DEPRESSION     PTSD complication, working at [employer]  . GLAUCOMA   . HYPERTENSION   . GERD   . ARTHRITIS   . Migraine   . Anxiety   . Abnormal Pap smear 1984    Cryo   Past Surgical History  Procedure Laterality Date  . Cesarean section  1990  . Bilateral breast reduction  1998  . Skin graft  2006  . Bladder tack  2008  . Abdominal hysterectomy  2008    TVH  . L3-5 decompression  06/2009  . Breast surgery    . Spine surgery    . Tubal ligation  1/90  . Cervix lesion destruction  1984  . Shoulder surgery Right    Family History  Problem Relation Age of Onset  . Arthritis Mother   . Diabetes Mother   . Dementia Mother   . Stroke Mother   .  Hypertension Mother   . Alzheimer's disease Mother   . Arthritis Father   . Cancer Father     prostate, metastatic turned into bone  . Stroke Father   . Hypertension Father   . Alcohol abuse Sister     committed suicide  . Diabetes Maternal Grandmother   . Cancer Maternal Grandfather   . Cancer Paternal Grandmother   . Cancer Paternal Grandfather    History  Substance Use Topics  . Smoking status: Never Smoker   . Smokeless tobacco: Not on file     Comment: Graduate of [employer]. employed @ [employer] 3rd shift  . Alcohol Use: 1.0 oz/week    2 drink(s) per week     Comment: Social   OB History   Grav Para Term Preterm Abortions TAB SAB Ect Mult Living   4 2 2  2     2      Review of Systems  Constitutional: Negative.  Negative for fever and chills.  Eyes: Negative.   Respiratory: Negative.   Cardiovascular: Positive for palpitations.  Gastrointestinal: Positive for nausea. Negative for vomiting.  Genitourinary: Negative.   Musculoskeletal: Negative.   Skin: Negative.   Neurological: Positive for headaches.  Psychiatric/Behavioral: Negative.     Allergies  Penicillins  Home Medications   Current Outpatient Rx  Name  Route  Sig  Dispense  Refill  .  ALPRAZolam (XANAX XR) 3 MG 24 hr tablet   Oral   Take 3 mg by mouth daily.         . ARIPiprazole (ABILIFY) 2 MG tablet   Oral   Take 2 mg by mouth daily.         Marland Kitchen EXPIRED: aspirin EC 81 MG tablet   Oral   Take 81 mg by mouth daily.         . celecoxib (CELEBREX) 200 MG capsule   Oral   Take 200 mg by mouth daily.         Marland Kitchen conjugated estrogens (PREMARIN) vaginal cream   Vaginal   Place 0.25 Applicatorfuls vaginally 2 (two) times a week.   42.5 g   4   . gabapentin (NEURONTIN) 300 MG capsule   Oral   Take 300 mg by mouth 3 (three) times daily.          . Multiple Vitamins-Minerals (MULTIVITAMIN & MINERAL PO)   Oral   Take by mouth daily. Alive 50+ MVI.         Marland Kitchen traZODone (DESYREL) 50  MG tablet   Oral   Take 100 mg by mouth at bedtime. 100-200mg          . Vilazodone HCl (VIIBRYD) 40 MG TABS   Oral   Take by mouth daily.         . Vitamin D, Ergocalciferol, (DRISDOL) 50000 UNITS CAPS   Oral   Take 1 capsule (50,000 Units total) by mouth every 7 (seven) days.   30 capsule   1    BP 130/77  Pulse 86  Temp(Src) 98.9 F (37.2 C) (Oral)  Resp 18  SpO2 96%  LMP 12/18/2005 Physical Exam  Nursing note and vitals reviewed. Constitutional: She is oriented to person, place, and time. She appears well-developed and well-nourished. No distress.  HENT:  Head: Normocephalic and atraumatic.  Right Ear: External ear normal.  Left Ear: External ear normal.  Mouth/Throat: Oropharynx is clear and moist.  Eyes: Conjunctivae and EOM are normal. Pupils are equal, round, and reactive to light.  Neck: Normal range of motion. Neck supple.  Cardiovascular: Normal rate, regular rhythm and normal heart sounds.   Pulmonary/Chest: Effort normal and breath sounds normal.  Abdominal: Soft. Bowel sounds are normal.  Musculoskeletal: Normal range of motion. She exhibits no edema and no tenderness.  Neurological: She is alert and oriented to person, place, and time.  No sensory or motor deficit.  Skin: Skin is warm and dry.  Psychiatric: She has a normal mood and affect. Her behavior is normal.    ED Course  Procedures (including critical care time) Labs Reviewed  CBC  BASIC METABOLIC PANEL   96:04 AM  Date: 11/24/2012  Rate:77  Rhythm: normal sinus rhythm  QRS Axis: normal  Intervals: normal  ST/T Wave abnormalities: normal  Conduction Disutrbances:none  Narrative Interpretation: Normal EKG  Old EKG Reviewed: none available  12:28 PM Results for orders placed during the hospital encounter of 11/24/12  CBC      Result Value Range   WBC 5.2  4.0 - 10.5 K/uL   RBC 4.34  3.87 - 5.11 MIL/uL   Hemoglobin 12.1  12.0 - 15.0 g/dL   HCT 54.0  98.1 - 19.1 %   MCV 85.3   78.0 - 100.0 fL   MCH 27.9  26.0 - 34.0 pg   MCHC 32.7  30.0 - 36.0 g/dL   RDW 47.8  29.5 - 62.1 %  Platelets 234  150 - 400 K/uL  BASIC METABOLIC PANEL      Result Value Range   Sodium 141  135 - 145 mEq/L   Potassium 4.1  3.5 - 5.1 mEq/L   Chloride 104  96 - 112 mEq/L   CO2 28  19 - 32 mEq/L   Glucose, Bld 99  70 - 99 mg/dL   BUN 9  6 - 23 mg/dL   Creatinine, Ser 1.61  0.50 - 1.10 mg/dL   Calcium 9.8  8.4 - 09.6 mg/dL   GFR calc non Af Amer 74 (*) >90 mL/min   GFR calc Af Amer 86 (*) >90 mL/min  POCT I-STAT TROPONIN I      Result Value Range   Troponin i, poc 0.01  0.00 - 0.08 ng/mL   Comment 3            Dg Chest 2 View  11/24/2012   *RADIOLOGY REPORT*  Clinical Data: Chest pain  CHEST - 2 VIEW  Comparison: April 20, 2011.  Findings: Cardiomediastinal silhouette appears normal.  No acute pulmonary disease is noted.  Bony thorax is intact.  IMPRESSION: No acute cardiopulmonary abnormality seen.   Original Report Authenticated By: Lupita Raider.,  M.D.    Lab workup negative.  Will treat for palpitations with propranolol 10 mg po bid.  F/U with Dr. Carlyon Prows.  12:41 PM Complains of Migraine headache.  Toradol 30 mg and Phenergan 25 mg IM ordered.  1. Palpitations         Carleene Cooper III, MD 11/24/12 [phone]

## 2012-11-24 NOTE — ED Notes (Signed)
Sent msg to Rx for phenergan

## 2012-11-25 ENCOUNTER — Ambulatory Visit: Payer: 59 | Admitting: Internal Medicine

## 2012-12-04 ENCOUNTER — Ambulatory Visit (INDEPENDENT_AMBULATORY_CARE_PROVIDER_SITE_OTHER): Payer: 59 | Admitting: Internal Medicine

## 2012-12-04 ENCOUNTER — Encounter: Payer: Self-pay | Admitting: Internal Medicine

## 2012-12-04 VITALS — BP 142/98 | HR 69 | Temp 98.0°F | Wt 194.4 lb

## 2012-12-04 DIAGNOSIS — I10 Essential (primary) hypertension: Secondary | ICD-10-CM

## 2012-12-04 DIAGNOSIS — R002 Palpitations: Secondary | ICD-10-CM

## 2012-12-04 MED ORDER — PROPRANOLOL HCL ER 60 MG PO CP24: 60.0000 mg | ORAL_CAPSULE | Freq: Every day | ORAL | Status: DC

## 2012-12-04 MED ORDER — HYDROCHLOROTHIAZIDE 25 MG PO TABS: 25.0000 mg | ORAL_TABLET | Freq: Every day | ORAL | Status: DC

## 2012-12-04 NOTE — Progress Notes (Signed)
Subjective:    Patient ID: Rachael Fleming, female    DOB: 1960/09/03, 52 y.o.   MRN: [id number]  HPI Patient presents today for follow up from ER visit 11/24/12 when she presented for heart palpitations. She was sent there after she called the office with complaints of palpitations for 20 minutes with squeezing sensation in her chest. Cardiac enzymes were negative x 3, EKG and CXR were normal. She was started on propranolol 10 mg bid. Today she states that the palpitations feel like a flapping sensation below her sternum and are accompanied by radiation to LUE and leg "pulsation sensation". They have been occuring for 2 weeks, with 2-3 episodes a day lasting 45-60 minutes during the 1st week before her ER visit. She claims the propranolol has helped, and now she is only experiencing the episodes once a day. She also claims her BP has been fluctuating recently when she checks it at work. She has had light-headedness and occasional episodes of near syncope, as well as occasional nausea during the palpitations. She admits to worsening swelling of her ankles bilaterally since she began working out. She denies SOB, headache, and chest pain. Pertinent past medical history includes hypertension; blood pressure was elevated at 142/98 today. She had a stress echo in 2011 that showed baseline moderate LVH, but no ischemic changes.   Past Medical History  Diagnosis Date  . DEPRESSION     PTSD complication, working at [employer]  . GLAUCOMA   . HYPERTENSION   . GERD   . ARTHRITIS   . Migraine   . Anxiety   . Abnormal Pap smear 1984    Cryo   Family History  Problem Relation Age of Onset  . Arthritis Mother   . Diabetes Mother   . Dementia Mother   . Stroke Mother   . Hypertension Mother   . Alzheimer's disease Mother   . Arthritis Father   . Cancer Father     prostate, metastatic turned into bone  . Stroke Father   . Hypertension Father   . Alcohol abuse Sister     committed suicide  .  Diabetes Maternal Grandmother   . Cancer Maternal Grandfather   . Cancer Paternal Grandmother   . Cancer Paternal Grandfather   She also reports heart disease and Massive MI in grandfather in 58's.  History  Substance Use Topics  . Smoking status: Never Smoker   . Smokeless tobacco: Not on file     Comment: Graduate of [employer]. employed @ [employer] 3rd shift  . Alcohol Use: 1.0 oz/week    2 drink(s) per week     Comment: Social      Review of Systems  Constitutional: Negative for fever and unexpected weight change.  Respiratory: Negative for cough and shortness of breath.   Cardiovascular: Positive for palpitations and leg swelling. Negative for chest pain.  Gastrointestinal: Negative for vomiting, abdominal pain, diarrhea, constipation and blood in stool.        Objective:   Physical Exam  Constitutional: She is oriented to person, place, and time. She appears well-developed and well-nourished. No distress.  HENT:  Mouth/Throat: Oropharynx is clear and moist.  Neck: Normal range of motion. Neck supple.  Cardiovascular: Normal rate, regular rhythm and normal heart sounds.   No murmur heard. Pulmonary/Chest: Breath sounds normal. She has no wheezes. She has no rales.  Abdominal: Soft. She exhibits no distension. There is no tenderness.  Musculoskeletal: She exhibits edema.  Mild edema of ankles  bilaterally  Neurological: She is alert and oriented to person, place, and time.  Psychiatric: She has a normal mood and affect.   BP 142/98  Pulse 69  Temp(Src) 98 F (36.7 C) (Oral)  Wt 194 lb 6.4 oz (88.179 kg)  BMI 35.55 kg/m2  SpO2 96%  LMP 12/18/2005 Lab Results  Component Value Date   WBC 5.2 11/24/2012   HGB 12.1 11/24/2012   HCT 37.0 11/24/2012   PLT 234 11/24/2012   GLUCOSE 99 11/24/2012   CHOL 241* 08/09/2011   TRIG 77.0 08/09/2011   HDL 86.10 08/09/2011   LDLDIRECT 145.9 08/09/2011   ALT 27 08/09/2011   AST 29 08/09/2011   NA 141 11/24/2012   K 4.1 11/24/2012   CL  104 11/24/2012   CREATININE 0.88 11/24/2012   BUN 9 11/24/2012   CO2 28 11/24/2012   TSH 1.31 08/09/2011   INR 0.94 06/26/2009   EKG from ER visit 11/24/12 reviewed: normal EKG, normal sinus rhythm -unchanged from previous tracings     Assessment & Plan:  1. Palpitations- Patient continues to experience symptoms. Continue propranolol since this medication seems to be helping with symptoms, but change dose to 60 mg daily of the extended release form. Check TSH for possible thyroid involvement. Refer to cardiologist for further evaluation of symptoms. Patient should follow up in 6 weeks for evaluation of symptoms and BP check.  2. Hypertension- uncontrolled, with recent fluctuations in BP and elevated BP of 142/98  today; Propanolol has been increased to 60 mg ER and will also start patient on HCTZ given mild edema. Follow up in 6 weeks to assess BP.  Concha Se, [place] [facility]ighbor   I have personally reviewed this case with PA student. I also personally examined this patient. I agree with history and findings as documented above. I reviewed, discussed and approve of the assessment and plan as listed above. Rene Paci, MD

## 2012-12-04 NOTE — Assessment & Plan Note (Signed)
BP Readings from Last 3 Encounters:  12/04/12 142/98  11/24/12 134/87  09/26/12 111/76   Uncontrolled - on ideral for palpitations and improved symptoms since ER visit 11/2012 Increase dose now and add diuretic  Recheck in 6 weeks

## 2012-12-04 NOTE — Patient Instructions (Signed)
It was good to see you today. We have reviewed your prior records including labs and tests today Test(s) ordered today. Your results will be released to MyChart (or called to you) after review, usually within 72hours after test completion. If any changes need to be made, you will be notified at that same time. we'll make referral to cardiology to review your palpitations and chest discomfort symptoms. Our office will contact you regarding appointment(s) once made. Medications reviewed and updated, change propanolol to higher dose, extended release once daily and begin diuretic once each morning for fluid retention Your prescription(s) have been submitted to your pharmacy. Please take as directed and contact our office if you believe you are having problem(s) with the medication(s). Please schedule followup in 6 weeks to review symptoms, test results and recheck blood pressure, call sooner if problems.  Palpitations  A palpitation is the feeling that your heartbeat is irregular or is faster than normal. It may feel like your heart is fluttering or skipping a beat. Palpitations are usually not a serious problem. However, in some cases, you may need further medical evaluation. CAUSES  Palpitations can be caused by:  Smoking.  Caffeine or other stimulants, such as diet pills or energy drinks.  Alcohol.  Stress and anxiety.  Strenuous physical activity.  Fatigue.  Certain medicines.  Heart disease, especially if you have a history of arrhythmias. This includes atrial fibrillation, atrial flutter, or supraventricular tachycardia.  An improperly working pacemaker or defibrillator. DIAGNOSIS  To find the cause of your palpitations, your caregiver will take your history and perform a physical exam. Tests may also be done, including:  Electrocardiography (ECG). This test records the heart's electrical activity.  Cardiac monitoring. This allows your caregiver to monitor your heart rate and  rhythm in real time.  Holter monitor. This is a portable device that records your heartbeat and can help diagnose heart arrhythmias. It allows your caregiver to track your heart activity for several days, if needed.  Stress tests by exercise or by giving medicine that makes the heart beat faster. TREATMENT  Treatment of palpitations depends on the cause of your symptoms and can vary greatly. Most cases of palpitations do not require any treatment other than time, relaxation, and monitoring your symptoms. Other causes, such as atrial fibrillation, atrial flutter, or supraventricular tachycardia, usually require further treatment. HOME CARE INSTRUCTIONS   Avoid:  Caffeinated coffee, tea, soft drinks, diet pills, and energy drinks.  Chocolate.  Alcohol.  Stop smoking if you smoke.  Reduce your stress and anxiety. Things that can help you relax include:  A method that measures bodily functions so you can learn to control them (biofeedback).  Yoga.  Meditation.  Physical activity such as swimming, jogging, or walking.  Get plenty of rest and sleep. SEEK MEDICAL CARE IF:   You continue to have a fast or irregular heartbeat beyond 24 hours.  Your palpitations occur more often. SEEK IMMEDIATE MEDICAL CARE IF:  You develop chest pain or shortness of breath.  You have a severe headache.  You feel dizzy, or you faint. MAKE SURE YOU:  Understand these instructions.  Will watch your condition.  Will get help right away if you are not doing well or get worse. Document Released: 05/03/2000 Document Revised: 11/05/2011 Document Reviewed: 07/05/2011 [facility] Patient Information 2014 [place], [place].

## 2013-01-12 ENCOUNTER — Other Ambulatory Visit: Payer: Self-pay | Admitting: Certified Nurse Midwife

## 2013-01-15 ENCOUNTER — Ambulatory Visit: Payer: 59 | Admitting: Internal Medicine

## 2013-01-28 ENCOUNTER — Ambulatory Visit: Payer: 59 | Admitting: Internal Medicine

## 2013-01-29 ENCOUNTER — Ambulatory Visit: Payer: 59 | Admitting: Internal Medicine

## 2013-01-29 ENCOUNTER — Encounter: Payer: Self-pay | Admitting: Cardiovascular Disease

## 2013-01-29 ENCOUNTER — Ambulatory Visit (INDEPENDENT_AMBULATORY_CARE_PROVIDER_SITE_OTHER): Payer: 59 | Admitting: Cardiovascular Disease

## 2013-01-29 VITALS — BP 126/82 | HR 60 | Ht 62.0 in | Wt 194.8 lb

## 2013-01-29 DIAGNOSIS — R002 Palpitations: Secondary | ICD-10-CM

## 2013-01-29 DIAGNOSIS — I10 Essential (primary) hypertension: Secondary | ICD-10-CM

## 2013-01-29 NOTE — Assessment & Plan Note (Signed)
Well controlled.  Continue current medications and low sodium Dash type diet.    

## 2013-01-29 NOTE — Progress Notes (Signed)
Patient ID: Rachael Fleming, female   DOB: 10/05/1960, 52 y.o.   MRN: [id number] Patient presents today for follow up from ER visit 11/24/12 when she presented for heart palpitations. She was sent there after she called the office with complaints of palpitations for 20 minutes with squeezing sensation in her chest. Cardiac enzymes were negative x 3, EKG and CXR were normal. She was started on propranolol 10 mg bid. Today she states that the palpitations feel like a flapping sensation below her sternum and are accompanied by radiation to LUE and leg "pulsation sensation". They have been occuring for 2 weeks, with 2-3 episodes a day lasting 45-60 minutes during the 1st week before her ER visit. She claims the propranolol has helped, and now she is only experiencing the episodes once a day. She also claims her BP has been fluctuating recently when she checks it at work. She has had light-headedness and occasional episodes of near syncope, as well as occasional nausea during the palpitations. She admits to worsening swelling of her ankles bilaterally since she began working out. She denies SOB, headache, and chest pain. Pertinent past medical history includes hypertension; blood pressure was elevated at 142/98 today. She had a stress echo in 2011 that showed baseline moderate LVH, but no ischemic changes.    TSH normal 3/13 and repeat has not been drawn yet.  Denies excess ETOH  Just social every 2 weeks  Coffee in am but no excess caffeine during day   ROS: Denies fever, malais, weight loss, blurry vision, decreased visual acuity, cough, sputum, SOB, hemoptysis, pleuritic pain, palpitaitons, heartburn, abdominal pain, melena, lower extremity edema, claudication, or rash.  All other systems reviewed and negative   General: Affect appropriate Healthy:  appears stated age HEENT: normal Neck supple with no adenopathy JVP normal no bruits no thyromegaly Lungs clear with no wheezing and good diaphragmatic  motion Heart:  S1/S2 no murmur,rub, gallop or click PMI normal Abdomen: benighn, BS positve, no tenderness, no AAA no bruit.  No HSM or HJR Distal pulses intact with no bruits No edema Neuro non-focal Skin warm and dry No muscular weakness  Medications Current Outpatient Prescriptions  Medication Sig Dispense Refill  . aspirin EC 81 MG tablet Take 81 mg by mouth daily.      Marland Kitchen conjugated estrogens (PREMARIN) vaginal cream Place 0.25 Applicatorfuls vaginally 2 (two) times a week.  42.5 g  4  . gabapentin (NEURONTIN) 300 MG capsule Take 300 mg by mouth 3 (three) times daily.       . hydrochlorothiazide (HYDRODIURIL) 25 MG tablet Take 1 tablet (25 mg total) by mouth daily.  30 tablet  11  . Multiple Vitamins-Minerals (MULTIVITAMIN & MINERAL PO) Take by mouth daily. Alive 50+ MVI.      Marland Kitchen propranolol ER (INDERAL LA) 60 MG 24 hr capsule Take 1 capsule (60 mg total) by mouth daily.  30 capsule  5  . traZODone (DESYREL) 50 MG tablet Take 100 mg by mouth at bedtime. 100-200mg       . Vilazodone HCl (VIIBRYD) 40 MG TABS Take 40 mg by mouth daily.        No current facility-administered medications for this visit.    Allergies Penicillins  Family History: Family History  Problem Relation Age of Onset  . Arthritis Mother   . Diabetes Mother   . Dementia Mother   . Stroke Mother   . Hypertension Mother   . Alzheimer's disease Mother   . Arthritis Father   .  Cancer Father     prostate, metastatic turned into bone  . Stroke Father   . Hypertension Father   . Alcohol abuse Sister     committed suicide  . Diabetes Maternal Grandmother   . Cancer Maternal Grandfather   . Cancer Paternal Grandmother   . Cancer Paternal Grandfather   . Coronary artery disease  73    Grandfather with MI    Social History: History   Social History  . Marital Status: Married    Spouse Name: N/A    Number of Children: N/A  . Years of Education: N/A   Occupational History  . Not on file.    Social History Main Topics  . Smoking status: Never Smoker   . Smokeless tobacco: Not on file     Comment: Graduate of [employer] college. employed @ [employer] 3rd shift  . Alcohol Use: 1.0 oz/week    2 drink(s) per week     Comment: Social  . Drug Use: No  . Sexual Activity: Yes    Partners: Male    Birth Control/ Protection: Surgical     Comment: hysterectomy   Other Topics Concern  . Not on file   Social History Narrative  . No narrative on file    Electrocardiogram:  7/9  SR rate 77 normal   Assessment and Plan

## 2013-01-29 NOTE — Patient Instructions (Addendum)
Your physician recommends that you schedule a follow-up appointment in: AS NEEDED  Your physician recommends that you continue on your current medications as directed. Please refer to the Current Medication list given to you today. Your physician recommends that you return for lab work in:   TODAY  TSH   Your physician has recommended that you wear an event monitor. Event monitors are medical devices that record the heart's electrical activity. Doctors most often [place] these monitors to diagnose arrhythmias. Arrhythmias are problems with the speed or rhythm of the heartbeat. The monitor is a small, portable device. You can wear one while you do your normal daily activities. This is usually used to diagnose what is causing palpitations/syncope (passing out).   Your physician has requested that you have an exercise tolerance test. For further information please visit [email or web address] Please also follow instruction sheet, as given.

## 2013-01-29 NOTE — Assessment & Plan Note (Signed)
Benign but persistant  Event monitor (too infrequent and not daily for holter)  Continue beta blocker echo ok  Draw TSH today  Will do ETT to make sure no exercise induced arrythmias  And assess HR and BP response to exercise

## 2013-02-02 ENCOUNTER — Encounter (INDEPENDENT_AMBULATORY_CARE_PROVIDER_SITE_OTHER): Payer: 59

## 2013-02-02 ENCOUNTER — Encounter: Payer: Self-pay | Admitting: *Deleted

## 2013-02-02 DIAGNOSIS — R002 Palpitations: Secondary | ICD-10-CM

## 2013-02-02 NOTE — Progress Notes (Signed)
Patient ID: Rachael Fleming, female   DOB: Dec 07, 1960, 52 y.o.   MRN: [id number] E-Cardio verite 30 day cardiac event monitor applied to patient.

## 2013-02-09 ENCOUNTER — Ambulatory Visit: Payer: 59 | Admitting: Internal Medicine

## 2013-02-12 ENCOUNTER — Ambulatory Visit: Payer: 59 | Admitting: Internal Medicine

## 2013-02-23 ENCOUNTER — Ambulatory Visit (INDEPENDENT_AMBULATORY_CARE_PROVIDER_SITE_OTHER): Payer: 59 | Admitting: Physician Assistant

## 2013-02-23 ENCOUNTER — Other Ambulatory Visit: Payer: Self-pay | Admitting: *Deleted

## 2013-02-23 DIAGNOSIS — R079 Chest pain, unspecified: Secondary | ICD-10-CM

## 2013-02-23 DIAGNOSIS — I10 Essential (primary) hypertension: Secondary | ICD-10-CM

## 2013-02-23 DIAGNOSIS — R002 Palpitations: Secondary | ICD-10-CM

## 2013-02-23 NOTE — Progress Notes (Signed)
Exercise Treadmill Test  Pre-Exercise Testing Evaluation Rhythm: normal sinus  Rate: 66                 Test  Exercise Tolerance Test Ordering MD: Charlton Haws, MD  Interpreting MD: Tereso Newcomer, PA-C  Unique Test No: 1  Treadmill:  1  Indication for ETT: Palpitations, dizziness  Contraindication to ETT: No   Stress Modality: exercise - treadmill  Cardiac Imaging Performed: non   Protocol: standard Bruce - maximal  Max BP:  148/79  Max MPHR (bpm):  167 85% MPR (bpm):  142  MPHR obtained (bpm):  150 % MPHR obtained:  89  Reached 85% MPHR (min:sec):  5:15 Total Exercise Time (min-sec):  6:30  Workload in METS:  7.5 Borg Scale: 17  Reason ETT Terminated:  patient's desire to stop    ST Segment Analysis At Rest: normal ST segments - no evidence of significant ST depression With Exercise: non-specific ST changes  Other Information Arrhythmia:  No Angina during ETT:  absent (0) Quality of ETT:  diagnostic  ETT Interpretation:  normal - no evidence of ischemia by ST analysis  Comments: Fair exercise capacity. No chest pain. Normal BP response to exercise. Artifact interferes with interpretation somewhat.  There are no significant ST-T changes to suggest ischemia.  No exercise induced arrhythmias.   Recommendations: F/u with Dr. Charlton Haws as directed. Signed,  Tereso Newcomer, PA-C   02/23/2013 3:15 PM

## 2013-02-26 ENCOUNTER — Ambulatory Visit: Payer: 59 | Admitting: Internal Medicine

## 2013-03-05 ENCOUNTER — Ambulatory Visit: Payer: 59 | Admitting: Internal Medicine

## 2013-03-05 DIAGNOSIS — Z0289 Encounter for other administrative examinations: Secondary | ICD-10-CM

## 2013-03-18 ENCOUNTER — Telehealth: Payer: Self-pay | Admitting: *Deleted

## 2013-03-18 NOTE — Telephone Encounter (Signed)
LMTCB  RE MONITOR PER  DR NISHAN SR  NO SIG  ARRHYTHMIAS .Rachael Fleming

## 2013-03-19 NOTE — Telephone Encounter (Signed)
PT  NOTIFIED ./CY 

## 2013-03-19 NOTE — Telephone Encounter (Signed)
LMTCB ./CY 

## 2013-03-19 NOTE — Telephone Encounter (Signed)
Follow Up ° °Pt returned call//  °

## 2013-03-20 DIAGNOSIS — R002 Palpitations: Secondary | ICD-10-CM

## 2013-03-20 HISTORY — DX: Palpitations: R00.2

## 2013-03-25 ENCOUNTER — Other Ambulatory Visit: Payer: Self-pay

## 2013-05-22 ENCOUNTER — Ambulatory Visit (INDEPENDENT_AMBULATORY_CARE_PROVIDER_SITE_OTHER): Payer: 59 | Admitting: Family Medicine

## 2013-05-22 VITALS — BP 128/82 | HR 72 | Temp 98.8°F | Resp 17 | Ht 63.0 in | Wt 186.0 lb

## 2013-05-22 DIAGNOSIS — R5381 Other malaise: Secondary | ICD-10-CM

## 2013-05-22 DIAGNOSIS — R05 Cough: Secondary | ICD-10-CM

## 2013-05-22 DIAGNOSIS — R5383 Other fatigue: Secondary | ICD-10-CM

## 2013-05-22 DIAGNOSIS — R059 Cough, unspecified: Secondary | ICD-10-CM

## 2013-05-22 LAB — POCT CBC
GRANULOCYTE PERCENT: 53.8 % (ref 37–80)
HCT, POC: 42.5 % (ref 37.7–47.9)
Hemoglobin: 12.9 g/dL (ref 12.2–16.2)
Lymph, poc: 1.7 (ref 0.6–3.4)
MCH, POC: 28.7 pg (ref 27–31.2)
MCHC: 30.4 g/dL — AB (ref 31.8–35.4)
MCV: 94.7 fL (ref 80–97)
MID (CBC): 0.5 (ref 0–0.9)
MPV: 9.2 fL (ref 0–99.8)
PLATELET COUNT, POC: 231 10*3/uL (ref 142–424)
POC Granulocyte: 2.5 (ref 2–6.9)
POC LYMPH PERCENT: 36 %L (ref 10–50)
POC MID %: 10.2 % (ref 0–12)
RBC: 4.49 M/uL (ref 4.04–5.48)
RDW, POC: 17.4 %
WBC: 4.7 10*3/uL (ref 4.6–10.2)

## 2013-05-22 LAB — POCT INFLUENZA A/B
INFLUENZA B, POC: NEGATIVE
Influenza A, POC: NEGATIVE

## 2013-05-22 MED ORDER — GUAIFENESIN-CODEINE 100-10 MG/5ML PO SYRP
5.0000 mL | ORAL_SOLUTION | Freq: Three times a day (TID) | ORAL | Status: DC | PRN
Start: 2013-05-22 — End: 2013-07-25

## 2013-05-22 MED ORDER — BENZONATATE 100 MG PO CAPS: 100.0000 mg | ORAL_CAPSULE | Freq: Three times a day (TID) | ORAL | Status: DC | PRN

## 2013-05-22 NOTE — Patient Instructions (Signed)
We are going to treat you for cough with tessalon perles and cough syrup.  Remember the syrup can make you drowsy.  I would cut your trazadone dose in half at night if you are going to use the trazadone and the syrup.  Remember do not drive on the syrup.    Give me a call if you are not better in the next few days- Sooner if worse.

## 2013-05-22 NOTE — Progress Notes (Signed)
[facility] [address] [place] [address] [phone]  Date:  05/22/2013   Name:  Rachael Fleming   DOB:  1960/12/18   MRN:  [id number]  PCP:  Gwendolyn Grant, MD    Chief Complaint: Cough, Fatigue and Nasal Congestion   History of Present Illness:  Rachael Fleming is a 53 y.o. very pleasant female patient who presents with the following:  She is here today with illness.  She noted first a scratchy, "tense" feeling throat about 3 days ago.  She is now coughing quite a bit but the cough is dry.   She feels a burning in her chest.  She has felt tired, no fever.  She has noted some chills.   She has noted a runny nose.  ST is persistent.    No GI symptoms.   History of hysterectomy. Here today with her husband.    She has tried some nyquil and cough medication. Also Cough drops She did take some advil this am for her back; she took it around 0700 this am     Patient Active Problem List   Diagnosis Date Noted  . Palpitations 12/04/2012  . Obese   . Insomnia 08/21/2010  . DEPRESSION 09/28/2008  . GLAUCOMA 09/28/2008  . HYPERTENSION 09/28/2008  . ARTHRITIS 09/28/2008  . HEADACHE 09/28/2008  . GERD 09/27/2008  . CONSTIPATION 09/27/2008    Past Medical History  Diagnosis Date  . DEPRESSION     PTSD complication, working at [employer]  . GLAUCOMA   . HYPERTENSION   . GERD   . ARTHRITIS   . Migraine   . Anxiety   . Abnormal Pap smear 1984    Cryo    Past Surgical History  Procedure Laterality Date  . Cesarean section  1990  . Bilateral breast reduction  1998  . Skin graft  2006  . Bladder tack  2008  . Abdominal hysterectomy  2008    TVH  . L3-5 decompression  06/2009  . Breast surgery    . Spine surgery    . Tubal ligation  1/90  . Cervix lesion destruction  1984  . Shoulder surgery Right     History  Substance Use Topics  . Smoking status: Never Smoker   . Smokeless tobacco: Not on file     Comment: Graduate of  [employer]. employed @ [employer] 3rd shift  . Alcohol Use: 1.0 oz/week    2 drink(s) per week     Comment: Social    Family History  Problem Relation Age of Onset  . Arthritis Mother   . Diabetes Mother   . Dementia Mother   . Stroke Mother   . Hypertension Mother   . Alzheimer's disease Mother   . Arthritis Father   . Cancer Father     prostate, metastatic turned into bone  . Stroke Father   . Hypertension Father   . Alcohol abuse Sister     committed suicide  . Diabetes Maternal Grandmother   . Cancer Maternal Grandfather   . Cancer Paternal Grandmother   . Cancer Paternal Grandfather   . Coronary artery disease  29    Grandfather with MI    Allergies  Allergen Reactions  . Penicillins     REACTION: Nausea \\T \ vomitting    Medication list has been reviewed and updated.  Current Outpatient Prescriptions on File Prior to Visit  Medication Sig Dispense Refill  . aspirin EC 81  MG tablet Take 81 mg by mouth daily.      Marland Kitchen gabapentin (NEURONTIN) 300 MG capsule Take 300 mg by mouth 3 (three) times daily.       . hydrochlorothiazide (HYDRODIURIL) 25 MG tablet Take 1 tablet (25 mg total) by mouth daily.  30 tablet  11  . Multiple Vitamins-Minerals (MULTIVITAMIN & MINERAL PO) Take by mouth daily. Alive 50+ MVI.      Marland Kitchen propranolol ER (INDERAL LA) 60 MG 24 hr capsule Take 1 capsule (60 mg total) by mouth daily.  30 capsule  5  . traZODone (DESYREL) 50 MG tablet Take 100 mg by mouth at bedtime. 100-200mg       . Vilazodone HCl (VIIBRYD) 40 MG TABS Take 40 mg by mouth daily.        No current facility-administered medications on file prior to visit.    Review of Systems:  As per HPI- otherwise negative.   Physical Examination: Filed Vitals:   05/22/13 0908  BP: 128/82  Pulse: 72  Temp: 98.8 F (37.1 C)  Resp: 17   Filed Vitals:   05/22/13 0908  Height: 5\' 3"  (1.6 m)  Weight: 186 lb (84.369 kg)   Body mass index is 32.96 kg/(m^2). Ideal Body Weight: Weight in  (lb) to have BMI = 25: 140.8  GEN: WDWN, NAD, Non-toxic, A & O x 3, obese, looks well, coughing occasionally HEENT: Atraumatic, Normocephalic. Neck supple. No masses, No LAD.  Bilateral TM wnl, oropharynx normal.  PEERL,EOMI.  Nasal cavity is congested Ears and Nose: No external deformity. CV: RRR, No M/G/R. No JVD. No thrill. No extra heart sounds. PULM: CTA B, no wheezes, crackles, rhonchi. No retractions. No resp. distress. No accessory muscle use. EXTR: No c/c/e NEURO Normal gait.  PSYCH: Normally interactive. Conversant. Not depressed or anxious appearing.  Calm demeanor.   Results for orders placed in visit on 05/22/13  POCT CBC      Result Value Range   WBC 4.7  4.6 - 10.2 K/uL   Lymph, poc 1.7  0.6 - 3.4   POC LYMPH PERCENT 36.0  10 - 50 %L   MID (cbc) 0.5  0 - 0.9   POC MID % 10.2  0 - 12 %M   POC Granulocyte 2.5  2 - 6.9   Granulocyte percent 53.8  37 - 80 %G   RBC 4.49  4.04 - 5.48 M/uL   Hemoglobin 12.9  12.2 - 16.2 g/dL   HCT, POC 42.5  37.7 - 47.9 %   MCV 94.7  80 - 97 fL   MCH, POC 28.7  27 - 31.2 pg   MCHC 30.4 (*) 31.8 - 35.4 g/dL   RDW, POC 17.4     Platelet Count, POC 231  142 - 424 K/uL   MPV 9.2  0 - 99.8 fL  POCT INFLUENZA A/B      Result Value Range   Influenza A, POC Negative     Influenza B, POC Negative      Assessment and Plan: Cough - Plan: POCT CBC, POCT Influenza A/B, benzonatate (TESSALON) 100 MG capsule, guaiFENesin-codeine (CHERATUSSIN AC) 100-10 MG/5ML syrup  Other malaise and fatigue - Plan: POCT CBC, POCT Influenza A/B  Treat for a viral URI with medications as above.  She has been on trazaone for sleep for some time.  Recommended that she halve her dose if she is going to take this along with cheratussin  Signed Lamar Blinks, MD

## 2013-05-25 ENCOUNTER — Telehealth: Payer: Self-pay

## 2013-05-25 NOTE — Telephone Encounter (Signed)
PT STATES SHE WORK AROUND [employer] TO ASK FOR A Z-PAK, PLEASE CALL [facility] ON [address]

## 2013-05-25 NOTE — Telephone Encounter (Signed)
Patient advised she has viral illness and Zpack is indicated for bacterial illnesses only. Encouraged her to increase her fluids, use ibuprofen 600-800 mg tid, and to use the cough medication. Advised her body will fight off the viral illness, but may take 7-10 days. Return if she worsens. She agrees. FYI only

## 2013-07-25 ENCOUNTER — Ambulatory Visit (INDEPENDENT_AMBULATORY_CARE_PROVIDER_SITE_OTHER): Payer: 59 | Admitting: Family Medicine

## 2013-07-25 VITALS — BP 130/80 | HR 60 | Temp 98.6°F | Resp 16 | Ht 62.5 in | Wt 188.0 lb

## 2013-07-25 DIAGNOSIS — G43901 Migraine, unspecified, not intractable, with status migrainosus: Secondary | ICD-10-CM

## 2013-07-25 MED ORDER — NALBUPHINE HCL 10 MG/ML IJ SOLN: 10.0000 mg | INTRAMUSCULAR | Status: DC | PRN

## 2013-07-25 MED ORDER — NALBUPHINE HCL 10 MG/ML IJ SOLN
10.0000 mg | Freq: Once | INTRAMUSCULAR | Status: AC
  Administered 2013-07-25: 10 mg via INTRAMUSCULAR

## 2013-07-25 MED ORDER — PROMETHAZINE HCL 25 MG/ML IJ SOLN
25.0000 mg | Freq: Once | INTRAMUSCULAR | Status: AC
  Administered 2013-07-25: 25 mg via INTRAMUSCULAR

## 2013-07-25 MED ORDER — PROMETHAZINE HCL 25 MG/ML IJ SOLN: 25.0000 mg | Freq: Four times a day (QID) | INTRAMUSCULAR | Status: DC | PRN

## 2013-07-25 NOTE — Patient Instructions (Signed)
Follow up with [facility]   Migraine Headache A migraine headache is an intense, throbbing pain on one or both sides of your head. A migraine can last for 30 minutes to several hours. CAUSES  The exact cause of a migraine headache is not always known. However, a migraine may be caused when nerves in the brain become irritated and release chemicals that cause inflammation. This causes pain. Certain things may also trigger migraines, such as:  Alcohol.  Smoking.  Stress.  Menstruation.  Aged cheeses.  Foods or drinks that contain nitrates, glutamate, aspartame, or tyramine.  Lack of sleep.  Chocolate.  Caffeine.  Hunger.  Physical exertion.  Fatigue.  Medicines used to treat chest pain (nitroglycerine), birth control pills, estrogen, and some blood pressure medicines. SIGNS AND SYMPTOMS  Pain on one or both sides of your head.  Pulsating or throbbing pain.  Severe pain that prevents daily activities.  Pain that is aggravated by any physical activity.  Nausea, vomiting, or both.  Dizziness.  Pain with exposure to bright lights, loud noises, or activity.  General sensitivity to bright lights, loud noises, or smells. Before you get a migraine, you may get warning signs that a migraine is coming (aura). An aura may include:  Seeing flashing lights.  Seeing bright spots, halos, or zig-zag lines.  Having tunnel vision or blurred vision.  Having feelings of numbness or tingling.  Having trouble talking.  Having muscle weakness. DIAGNOSIS  A migraine headache is often diagnosed based on:  Symptoms.  Physical exam.  A CT scan or MRI of your head. These imaging tests cannot diagnose migraines, but they can help rule out other causes of headaches. TREATMENT Medicines may be given for pain and nausea. Medicines can also be given to help prevent recurrent migraines.  HOME CARE INSTRUCTIONS  Only take over-the-counter or prescription medicines  for pain or discomfort as directed by your health care provider. The use of long-term narcotics is not recommended.  Lie down in a dark, quiet room when you have a migraine.  Keep a journal to find out what may trigger your migraine headaches. For example, write down:  What you eat and drink.  How much sleep you get.  Any change to your diet or medicines.  Limit alcohol consumption.  Quit smoking if you smoke.  Get 7 9 hours of sleep, or as recommended by your health care provider.  Limit stress.  Keep lights dim if bright lights bother you and make your migraines worse. SEEK IMMEDIATE MEDICAL CARE IF:   Your migraine becomes severe.  You have a fever.  You have a stiff neck.  You have vision loss.  You have muscular weakness or loss of muscle control.  You start losing your balance or have trouble walking.  You feel faint or pass out.  You have severe symptoms that are different from your first symptoms. MAKE SURE YOU:   Understand these instructions.  Will watch your condition.  Will get help right away if you are not doing well or get worse. Document Released: 05/06/2005 Document Revised: 02/24/2013 Document Reviewed: 01/11/2013 [facility] Patient Information 2014 [place].

## 2013-07-25 NOTE — Progress Notes (Signed)
° °  Subjective:    Patient ID: Rachael Fleming, female    DOB: [birth year], 53 y.o.   MRN: [id number]  HPI This chart was scribed for Robyn Haber, MD by Thea Alken, ED Scribe. This patient was seen in room 8 and the patient's care was started at 2:15 PM.  HPI Comments: Rachael Fleming is a 53 y.o. female who presents to the [facility] complaining of a moderate migraine onset 1 day. She reports her most recent migraines was months ago but reports that she has been having low grade HA up until yesterday afternoon. Pt reports that she can barely turn her head. Pt states while she was at church today she felt like she was going to die. She states that she felt as if she was going to fall out. Pt is concerned that she may be having a stroke and is afraid. She reports that she was able to sleep at night. Pt states that she has taken BC's without relief to symptoms.  She reports nausea and photophobia. She denies emesis, visual disturbances. Pt reports that her father had migraine.    Past Medical History  Diagnosis Date   DEPRESSION     PTSD complication, working at [employer]    GERD    ARTHRITIS    Migraine    Anxiety    Abnormal Pap smear 1984    Cryo   Allergies  Allergen Reactions   Penicillins     REACTION: Nausea \\T \ vomitting   Prior to Admission medications   Medication Sig Start Date End Date Taking? Authorizing Provider  aspirin EC 81 MG tablet Take 81 mg by mouth daily.   Yes Historical Provider, MD  hydrochlorothiazide (HYDRODIURIL) 25 MG tablet Take 1 tablet (25 mg total) by mouth daily. 12/04/12  Yes Rowe Clack, MD  Multiple Vitamins-Minerals (MULTIVITAMIN & MINERAL PO) Take by mouth daily. Alive 50+ MVI.   Yes Historical Provider, MD  propranolol ER (INDERAL LA) 60 MG 24 hr capsule Take 1 capsule (60 mg total) by mouth daily. 12/04/12  Yes Rowe Clack, MD  traZODone (DESYREL) 50 MG tablet Take  100 mg by mouth at bedtime. 100-200mg    Yes Historical Provider, MD  Vilazodone HCl (VIIBRYD) 40 MG TABS Take 40 mg by mouth daily.    Yes Historical Provider, MD    Review of Systems  Eyes: Positive for photophobia. Negative for visual disturbance.  Gastrointestinal: Positive for nausea. Negative for vomiting.  Neurological: Positive for headaches. Negative for syncope.  Psychiatric/Behavioral: Negative for sleep disturbance.       Objective:   Physical Exam  Nursing note and vitals reviewed. Constitutional: She is oriented to person, place, and time. She appears well-developed and well-nourished. No distress.  HENT:  Head: Normocephalic and atraumatic.  Eyes: EOM are normal.  Neck: Neck supple.  Cardiovascular: Normal rate.   Pulmonary/Chest: Effort normal.  Musculoskeletal: Normal range of motion.  Neurological: She is alert and oriented to person, place, and time.  Skin: Skin is warm and dry.  Psychiatric: Her behavior is normal.  Patient is tearful and cries easily when doing the review of systems      Assessment & Plan:  Migraine with status migrainosus - Plan: promethazine (PHENERGAN) injection 25 mg, nalbuphine (NUBAIN) injection 10 mg, DISCONTINUED: nalbuphine (NUBAIN) injection 10 mg  Signed, Robyn Haber, MD

## 2013-07-29 ENCOUNTER — Other Ambulatory Visit: Payer: Self-pay | Admitting: Internal Medicine

## 2013-08-24 ENCOUNTER — Ambulatory Visit: Payer: 59 | Admitting: Certified Nurse Midwife

## 2013-08-26 ENCOUNTER — Encounter: Payer: Self-pay | Admitting: Certified Nurse Midwife

## 2013-08-26 ENCOUNTER — Other Ambulatory Visit: Payer: Self-pay

## 2013-08-26 ENCOUNTER — Ambulatory Visit (INDEPENDENT_AMBULATORY_CARE_PROVIDER_SITE_OTHER): Payer: 59 | Admitting: Certified Nurse Midwife

## 2013-08-26 VITALS — BP 138/70 | HR 72 | Resp 18 | Ht 61.5 in | Wt 179.0 lb

## 2013-08-26 DIAGNOSIS — Z01419 Encounter for gynecological examination (general) (routine) without abnormal findings: Secondary | ICD-10-CM

## 2013-08-26 DIAGNOSIS — Z9889 Other specified postprocedural states: Secondary | ICD-10-CM

## 2013-08-26 DIAGNOSIS — Z23 Encounter for immunization: Secondary | ICD-10-CM

## 2013-08-26 DIAGNOSIS — Z0000 Encounter for general adult medical examination without abnormal findings: Secondary | ICD-10-CM

## 2013-08-26 DIAGNOSIS — Z1231 Encounter for screening mammogram for malignant neoplasm of breast: Secondary | ICD-10-CM

## 2013-08-26 LAB — POCT URINALYSIS DIPSTICK
Bilirubin, UA: NEGATIVE
Glucose, UA: NEGATIVE
Ketones, UA: NEGATIVE
LEUKOCYTES UA: NEGATIVE
NITRITE UA: NEGATIVE
PH UA: 5
PROTEIN UA: NEGATIVE
UROBILINOGEN UA: NEGATIVE

## 2013-08-26 LAB — HEMOGLOBIN, FINGERSTICK: Hemoglobin, fingerstick: 12.6 g/dL (ref 12.0–16.0)

## 2013-08-26 NOTE — Patient Instructions (Signed)

## 2013-08-26 NOTE — Progress Notes (Signed)
53 y.o. [id number] Married African American Fe here for annual exam. Menopausal on ERT working well. Continues with hot flashes of and on.  Denies vaginal bleeding. Premarin cream working well for vaginal dryness. Patient has been off her Enjuvia due to personal issues. Patient feels need to restart due increase of hot flashes. Patient sees PCP for hypertension/depression management, aex and labs. No medication change in last year. No other health issues today.  Patient's last menstrual period was 12/18/2005.          Sexually active: yes  The current method of family planning is status post hysterectomy.    Exercising: yes  Walking, [employer], [employer] Smoker:  no  Health Maintenance: Pap:  05/2008 - Neg MMG:  08/2011 BI-RADS 1 : Neg Colonoscopy:  2010 - Every 10 years BMD:   Never TDaP:  2006 Labs:  Hem: 12.6   reports that she has never smoked. She has never used smokeless tobacco. She reports that she drinks about one ounce of alcohol per week. She reports that she does not use illicit drugs.  Past Medical History  Diagnosis Date  . DEPRESSION     PTSD complication, working at [employer]  . GLAUCOMA   . HYPERTENSION   . GERD   . ARTHRITIS   . Migraine   . Anxiety   . Abnormal Pap smear 1984    Cryo  . Heart palpitations 03/2013    Past Surgical History  Procedure Laterality Date  . Cesarean section  1990  . Bilateral breast reduction  1998  . Skin graft  2006  . Bladder tack  2008  . Abdominal hysterectomy  2008    TVH  . L3-5 decompression  06/2009  . Breast surgery    . Spine surgery    . Tubal ligation  1/90  . Cervix lesion destruction  1984  . Shoulder surgery Right     Current Outpatient Prescriptions  Medication Sig Dispense Refill  . aspirin EC 81 MG tablet Take 81 mg by mouth daily.      Marland Kitchen gabapentin (NEURONTIN) 400 MG capsule Take 1,200 mg by mouth at bedtime.       . hydrochlorothiazide (HYDRODIURIL) 25 MG tablet Take 1 tablet (25 mg total) by mouth  daily.  30 tablet  11  . Multiple Vitamins-Minerals (MULTIVITAMIN & MINERAL PO) Take by mouth daily. Alive 50+ MVI.      Marland Kitchen propranolol ER (INDERAL LA) 60 MG 24 hr capsule Take 60 mg by mouth daily.      . traZODone (DESYREL) 50 MG tablet Take 100 mg by mouth at bedtime. 100-200mg       . Vilazodone HCl (VIIBRYD) 40 MG TABS Take 40 mg by mouth daily.        No current facility-administered medications for this visit.    Family History  Problem Relation Age of Onset  . Arthritis Mother   . Diabetes Mother   . Dementia Mother   . Stroke Mother   . Hypertension Mother   . Alzheimer's disease Mother   . Arthritis Father   . Cancer Father     prostate, metastatic turned into bone  . Stroke Father   . Hypertension Father   . Alcohol abuse Sister     committed suicide  . Diabetes Maternal Grandmother   . Cancer Maternal Grandfather   . Cancer Paternal Grandmother   . Cancer Paternal Grandfather   . Coronary artery disease  76    Grandfather with MI  ROS:  Pertinent items are noted in HPI.  Otherwise, a comprehensive ROS was negative.  Exam:   BP 138/70  Pulse 72  Resp 18  Ht 5' 1.5" (1.562 m)  Wt 179 lb (81.194 kg)  BMI 33.28 kg/m2  LMP 12/18/2005 Height: 5' 1.5" (156.2 cm)  Ht Readings from Last 3 Encounters:  08/26/13 5' 1.5" (1.562 m)  07/25/13 5' 2.5" (1.588 m)  05/22/13 5\' 3"  (1.6 m)    General appearance: alert, cooperative and appears stated age Head: Normocephalic, without obvious abnormality, atraumatic Neck: no adenopathy, supple, symmetrical, trachea midline and thyroid normal to inspection and palpation and non-palpable Lungs: clear to auscultation bilaterally Breasts: normal appearance, no masses or tenderness, No nipple retraction or dimpling, No nipple discharge or bleeding, No axillary or supraclavicular adenopathy Heart: regular rate and rhythm Abdomen: soft, non-tender; no masses,  no organomegaly Extremities: extremities normal, atraumatic, no  cyanosis or edema Skin: Skin color, texture, turgor normal. No rashes or lesions Lymph nodes: Cervical, supraclavicular, and axillary nodes normal. No abnormal inguinal nodes palpated Neurologic: Grossly normal   Pelvic: External genitalia:  no lesions              Urethra:  normal appearing urethra with no masses, tenderness or lesions              Bartholin's and Skene's: normal                 Vagina: normal appearing vagina with normal color and discharge, no lesions              Cervix: absent              Pap taken: no Bimanual Exam:  Uterus:  uterus absent              Adnexa: no mass, fullness, tenderness and right adnexa normal, non tender, left absent               Rectovaginal: Confirms               Anus:  normal sphincter tone, no lesions  A:  Well Woman with normal exam  Menopausal previous ERT use, has been of for the past year and dealing with hot flashes( unable to purchase) s/p TVH LSO due to bleeding  Atrophic vaginitis Premarin cream working well  Hypertension/depression with PCP management, no medication change in past year.  Immunization update  P:   Reviewed health and wellness pertinent to exam  Discussed risks and benefits of ERT and will need to have current mammogram before prescribed, patient agreeable( scheduled while in office)  Will refill Premarin when mammogram in also  Continue follow up with PCP as indicated  Requests TDAP  Pap smear as per guidelines   Mammogram scheduled 09/15/13 pap smear not taken today counseled on breast self exam, mammography screening, adequate intake of calcium and vitamin D, diet and exercise, Kegel's exercises  return annually or prn  An After Visit Summary was printed and given to the patient.

## 2013-08-27 NOTE — Progress Notes (Signed)
Reviewed personally.  M. Suzanne Avan Gullett, MD.  

## 2013-09-05 ENCOUNTER — Encounter: Payer: Self-pay | Admitting: Physician Assistant

## 2013-09-05 ENCOUNTER — Ambulatory Visit (INDEPENDENT_AMBULATORY_CARE_PROVIDER_SITE_OTHER): Payer: 59 | Admitting: Emergency Medicine

## 2013-09-05 ENCOUNTER — Ambulatory Visit: Payer: 59

## 2013-09-05 VITALS — BP 110/70 | HR 75 | Temp 98.3°F | Resp 16 | Ht 61.5 in | Wt 178.0 lb

## 2013-09-05 DIAGNOSIS — W19XXXA Unspecified fall, initial encounter: Secondary | ICD-10-CM

## 2013-09-05 DIAGNOSIS — M25552 Pain in left hip: Secondary | ICD-10-CM

## 2013-09-05 DIAGNOSIS — M25559 Pain in unspecified hip: Secondary | ICD-10-CM

## 2013-09-05 MED ORDER — MELOXICAM 15 MG PO TABS: 15.0000 mg | ORAL_TABLET | Freq: Every day | ORAL | Status: DC

## 2013-09-05 MED ORDER — HYDROCODONE-ACETAMINOPHEN 5-325 MG PO TABS: 1.0000 | ORAL_TABLET | Freq: Four times a day (QID) | ORAL | Status: DC | PRN

## 2013-09-05 NOTE — Patient Instructions (Signed)
Take the meloxicam (Mobic) once daily - this will help with pain and inflammation.  Do not take any additional Advil or Aleve  Use the Norco if needed for additional pain relief  Take it easy for the next few days - no running, jumping, martial arts  Ice or heat to the area  If any symptoms are worsening, or if you are not seeing improvement within 1 week - please let us know
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
## 2013-09-15 ENCOUNTER — Ambulatory Visit: Payer: 59

## 2013-09-16 ENCOUNTER — Ambulatory Visit
Admission: RE | Admit: 2013-09-16 | Discharge: 2013-09-16 | Disposition: A | Discharge status: Home / Self Care | Payer: 59 | Source: Ambulatory Visit

## 2013-09-16 DIAGNOSIS — Z9889 Other specified postprocedural states: Secondary | ICD-10-CM

## 2013-09-16 DIAGNOSIS — Z1231 Encounter for screening mammogram for malignant neoplasm of breast: Secondary | ICD-10-CM

## 2013-09-17 ENCOUNTER — Other Ambulatory Visit: Payer: Self-pay | Admitting: Certified Nurse Midwife

## 2013-09-17 DIAGNOSIS — R928 Other abnormal and inconclusive findings on diagnostic imaging of breast: Secondary | ICD-10-CM

## 2013-09-29 ENCOUNTER — Ambulatory Visit
Admission: RE | Admit: 2013-09-29 | Discharge: 2013-09-29 | Disposition: A | Discharge status: Home / Self Care | Payer: 59 | Source: Ambulatory Visit | Attending: Certified Nurse Midwife | Admitting: Certified Nurse Midwife

## 2013-09-29 DIAGNOSIS — R928 Other abnormal and inconclusive findings on diagnostic imaging of breast: Secondary | ICD-10-CM

## 2014-01-26 ENCOUNTER — Other Ambulatory Visit: Payer: Self-pay | Admitting: Internal Medicine

## 2014-01-26 ENCOUNTER — Other Ambulatory Visit: Payer: Self-pay

## 2014-01-26 MED ORDER — HYDROCHLOROTHIAZIDE 25 MG PO TABS: 25.0000 mg | ORAL_TABLET | Freq: Every day | ORAL | Status: DC

## 2014-02-22 ENCOUNTER — Encounter: Payer: 59 | Admitting: Internal Medicine

## 2014-02-28 ENCOUNTER — Ambulatory Visit (INDEPENDENT_AMBULATORY_CARE_PROVIDER_SITE_OTHER): Payer: 59 | Admitting: Family Medicine

## 2014-02-28 VITALS — BP 125/74 | HR 56 | Temp 97.9°F | Resp 12 | Ht 61.5 in | Wt 170.6 lb

## 2014-02-28 DIAGNOSIS — H81399 Other peripheral vertigo, unspecified ear: Secondary | ICD-10-CM

## 2014-02-28 DIAGNOSIS — J0120 Acute ethmoidal sinusitis, unspecified: Secondary | ICD-10-CM

## 2014-02-28 DIAGNOSIS — H8109 Meniere's disease, unspecified ear: Secondary | ICD-10-CM

## 2014-02-28 MED ORDER — GUAIFENESIN ER 1200 MG PO TB12: 1.0000 | ORAL_TABLET | Freq: Two times a day (BID) | ORAL | Status: DC | PRN

## 2014-02-28 MED ORDER — CEFDINIR 300 MG PO CAPS: 300.0000 mg | ORAL_CAPSULE | Freq: Two times a day (BID) | ORAL | Status: DC

## 2014-02-28 MED ORDER — IPRATROPIUM BROMIDE 0.03 % NA SOLN
2.0000 | Freq: Four times a day (QID) | NASAL | Status: DC
Start: 2014-02-28 — End: 2014-06-08

## 2014-02-28 MED ORDER — MECLIZINE HCL 25 MG PO TABS: 25.0000 mg | ORAL_TABLET | Freq: Three times a day (TID) | ORAL | Status: DC | PRN

## 2014-02-28 NOTE — Progress Notes (Signed)
Subjective:  This chart was scribed for Delman Cheadle, MD by Randa Evens, ED Scribe. This Patient was seen in room 08 and the patients care was started at 7:31 PM   Patient ID: Gilmer Mor, female    DOB: 09/28/1960, 53 y.o.   MRN: [id number]  Chief Complaint  Patient presents with  . Sinus Problem    x1 week;   . Dizziness    pt states she is fine standing still but once she starts moving around she gets very dizzy  . Headache    x1 week  . Nasal Congestion    Sinus Problem Associated symptoms include coughing, headaches, sinus pressure and sneezing.  Dizziness Associated symptoms include coughing, headaches and nausea. Pertinent negatives include no vomiting.  Headache  Associated symptoms include coughing, dizziness, nausea, rhinorrhea and sinus pressure. Pertinent negatives include no vomiting.   HPI Comments: SHERLIE BOYUM is a 53 y.o. female who presents to the [facility] complaining of clear rhinorrhea onset 1 week prior.  She states she has associated headache, nausea, sinus pressure, post nasal drip, cough, sneezing. She states she has also had dizziness that started 3 days ago. She states that after standing still her dizziness goes away after a few seconds. She state she has recently been around her husband who was recently sick with similar symptoms. She states she has tried OTC cold medications with no relief. She denies taking any medications for the dizziness. She denies vomiting or sleep disturbance.    Past Medical History  Diagnosis Date  . DEPRESSION     PTSD complication, working at [employer]  . GLAUCOMA   . HYPERTENSION   . GERD   . ARTHRITIS   . Migraine   . Anxiety   . Abnormal Pap smear 1984    Cryo  . Heart palpitations 03/2013   Current Outpatient Prescriptions on File Prior to Visit  Medication Sig Dispense Refill  . aspirin EC 81 MG tablet Take 81 mg by mouth daily.      Marland Kitchen gabapentin (NEURONTIN) 400 MG  capsule Take 1,200 mg by mouth at bedtime.       . hydrochlorothiazide (HYDRODIURIL) 25 MG tablet TAKE 1 TABLET (25 MG TOTAL) BY MOUTH DAILY.  30 tablet  3  . meloxicam (MOBIC) 15 MG tablet Take 1 tablet (15 mg total) by mouth daily.  30 tablet  1  . Multiple Vitamins-Minerals (MULTIVITAMIN & MINERAL PO) Take by mouth daily. Alive 50+ MVI.      Marland Kitchen propranolol ER (INDERAL LA) 60 MG 24 hr capsule Take 60 mg by mouth daily.      . traZODone (DESYREL) 50 MG tablet Take 100 mg by mouth at bedtime. 100-200mg       . Vilazodone HCl (VIIBRYD) 40 MG TABS Take 40 mg by mouth daily.        No current facility-administered medications on file prior to visit.   Allergies  Allergen Reactions  . Penicillins     REACTION: Nausea \\T \ vomitting     Review of Systems  HENT: Positive for postnasal drip, rhinorrhea, sinus pressure and sneezing.   Respiratory: Positive for cough.   Gastrointestinal: Positive for nausea. Negative for vomiting.  Neurological: Positive for dizziness and headaches.  Psychiatric/Behavioral: Negative for sleep disturbance.    Objective:   BP 125/74  Pulse 56  Temp(Src) 97.9 F (36.6 C)  Resp 12  Ht 5' 1.5" (1.562 m)  Wt 170 lb 9.6 oz (77.384  kg)  BMI 31.72 kg/m2  SpO2 99%  LMP 12/18/2005   Physical Exam  Nursing note and vitals reviewed. Constitutional: She is oriented to person, place, and time. She appears well-developed and well-nourished. No distress.  HENT:  Head: Normocephalic and atraumatic.  Right Ear: Tympanic membrane is erythematous.  Left Ear: A middle ear effusion is present.  Nose: Mucosal edema present.  Mouth/Throat: No oropharyngeal exudate, posterior oropharyngeal edema or posterior oropharyngeal erythema.  Eyes: Conjunctivae and EOM are normal.  Neck: Neck supple. No tracheal deviation present.  Cardiovascular: Normal rate, regular rhythm and normal heart sounds.   Pulmonary/Chest: Effort normal. No respiratory distress.  Musculoskeletal:  Normal range of motion.  Lymphadenopathy:    She has no cervical adenopathy.  Neurological: She is alert and oriented to person, place, and time.  Skin: Skin is warm and dry.  Psychiatric: She has a normal mood and affect. Her behavior is normal.    Assessment & Plan:   Acute ethmoidal sinusitis, recurrence not specified  Vertigo, labyrinthine, unspecified laterality - should be self-limited, RTC if worsening or does not resolve after 1 wk  Meds ordered this encounter  Medications  . cefdinir (OMNICEF) 300 MG capsule    Sig: Take 1 capsule (300 mg total) by mouth 2 (two) times daily.    Dispense:  20 capsule    Refill:  0  . ipratropium (ATROVENT) 0.03 % nasal spray    Sig: Place 2 sprays into the nose 4 (four) times daily.    Dispense:  30 mL    Refill:  1  . Guaifenesin (MUCINEX MAXIMUM STRENGTH) 1200 MG TB12    Sig: Take 1 tablet (1,200 mg total) by mouth every 12 (twelve) hours as needed.    Dispense:  14 tablet    Refill:  1  . meclizine (ANTIVERT) 25 MG tablet    Sig: Take 1 tablet (25 mg total) by mouth 3 (three) times daily as needed for dizziness.    Dispense:  30 tablet    Refill:  0    I personally performed the services described in this documentation, which was scribed in my presence. The recorded information has been reviewed and considered, and addended by me as needed.  Delman Cheadle, MD MPH

## 2014-02-28 NOTE — Patient Instructions (Signed)
Labyrinthitis (Inner Ear Inflammation) Your exam shows you have an inner ear disturbance or labyrinthitis. The cause of this condition is not known. But it may be due to a virus infection. The symptoms of labyrinthitis include vertigo or dizziness made worse by motion, nausea and vomiting. The onset of labyrinthitis may be very sudden. It usually lasts for a few days and then clears up over 1-2 weeks. The treatment of an inner ear disturbance includes bed rest and medications to reduce dizziness, nausea, and vomiting. You should stay away from alcohol, tranquilizers, caffeine, nicotine, or any medicine your doctor thinks may make your symptoms worse. Further testing may be needed to evaluate your hearing and balance system. Please see your doctor or go to the emergency room right away if you have:  Increasing vertigo, earache, loss of hearing, or ear drainage.  Headache, blurred vision, trouble walking, fainting, or fever.  Persistent vomiting, dehydration, or extreme weakness. Document Released: 05/06/2005 Document Revised: 07/29/2011 Document Reviewed: 10/22/2006 [facility] Patient Information 2015 [place], [place]. This information is not intended to replace advice given to you by your health care provider. Make sure you discuss any questions you have with your health care provider. Vertigo Vertigo means you feel like you or your surroundings are moving when they are not. Vertigo can be dangerous if it occurs when you are at work, driving, or performing difficult activities.  CAUSES  Vertigo occurs when there is a conflict of signals sent to your brain from the visual and sensory systems in your body. There are many different causes of vertigo, including:  Infections, especially in the inner ear.  A bad reaction to a drug or misuse of alcohol and medicines.  Withdrawal from drugs or alcohol.  Rapidly changing positions, such as lying down or rolling over in bed.  A migraine  headache.  Decreased blood flow to the brain.  Increased pressure in the brain from a head injury, infection, tumor, or bleeding. SYMPTOMS  You may feel as though the world is spinning around or you are falling to the ground. Because your balance is upset, vertigo can cause nausea and vomiting. You may have involuntary eye movements (nystagmus). DIAGNOSIS  Vertigo is usually diagnosed by physical exam. If the cause of your vertigo is unknown, your caregiver may perform imaging tests, such as an MRI scan (magnetic resonance imaging). TREATMENT  Most cases of vertigo resolve on their own, without treatment. Depending on the cause, your caregiver may prescribe certain medicines. If your vertigo is related to body position issues, your caregiver may recommend movements or procedures to correct the problem. In rare cases, if your vertigo is caused by certain inner ear problems, you may need surgery. HOME CARE INSTRUCTIONS   Follow your caregiver's instructions.  Avoid driving.  Avoid operating heavy machinery.  Avoid performing any tasks that would be dangerous to you or others during a vertigo episode.  Tell your caregiver if you notice that certain medicines seem to be causing your vertigo. Some of the medicines used to treat vertigo episodes can actually make them worse in some people. SEEK IMMEDIATE MEDICAL CARE IF:   Your medicines do not relieve your vertigo or are making it worse.  You develop problems with talking, walking, weakness, or using your arms, hands, or legs.  You develop severe headaches.  Your nausea or vomiting continues or gets worse.  You develop visual changes.  A family member notices behavioral changes.  Your condition gets worse. MAKE SURE YOU:  Understand these  instructions.  Will watch your condition.  Will get help right away if you are not doing well or get worse. Document Released: 02/13/2005 Document Revised: 07/29/2011 Document Reviewed:  11/22/2010 [facility] Patient Information 2015 [place], [place]. This information is not intended to replace advice given to you by your health care provider. Make sure you discuss any questions you have with your health care provider.  Sinusitis Sinusitis is redness, soreness, and inflammation of the paranasal sinuses. Paranasal sinuses are air pockets within the bones of your face (beneath the eyes, the middle of the forehead, or above the eyes). In healthy paranasal sinuses, mucus is able to drain out, and air is able to circulate through them by way of your nose. However, when your paranasal sinuses are inflamed, mucus and air can become trapped. This can allow bacteria and other germs to grow and cause infection. Sinusitis can develop quickly and last only a short time (acute) or continue over a long period (chronic). Sinusitis that lasts for more than 12 weeks is considered chronic.  CAUSES  Causes of sinusitis include:  Allergies.  Structural abnormalities, such as displacement of the cartilage that separates your nostrils (deviated septum), which can decrease the air flow through your nose and sinuses and affect sinus drainage.  Functional abnormalities, such as when the small hairs (cilia) that line your sinuses and help remove mucus do not work properly or are not present. SIGNS AND SYMPTOMS  Symptoms of acute and chronic sinusitis are the same. The primary symptoms are pain and pressure around the affected sinuses. Other symptoms include:  Upper toothache.  Earache.  Headache.  Bad breath.  Decreased sense of smell and taste.  A cough, which worsens when you are lying flat.  Fatigue.  Fever.  Thick drainage from your nose, which often is green and may contain pus (purulent).  Swelling and warmth over the affected sinuses. DIAGNOSIS  Your health care provider will perform a physical exam. During the exam, your health care provider may:  Look in your nose for signs of  abnormal growths in your nostrils (nasal polyps).  Tap over the affected sinus to check for signs of infection.  View the inside of your sinuses (endoscopy) using an imaging device that has a light attached (endoscope). If your health care provider suspects that you have chronic sinusitis, one or more of the following tests may be recommended:  Allergy tests.  Nasal culture. A sample of mucus is taken from your nose, sent to a lab, and screened for bacteria.  Nasal cytology. A sample of mucus is taken from your nose and examined by your health care provider to determine if your sinusitis is related to an allergy. TREATMENT  Most cases of acute sinusitis are related to a viral infection and will resolve on their own within 10 days. Sometimes medicines are prescribed to help relieve symptoms (pain medicine, decongestants, nasal steroid sprays, or saline sprays).  However, for sinusitis related to a bacterial infection, your health care provider will prescribe antibiotic medicines. These are medicines that will help kill the bacteria causing the infection.  Rarely, sinusitis is caused by a fungal infection. In theses cases, your health care provider will prescribe antifungal medicine. For some cases of chronic sinusitis, surgery is needed. Generally, these are cases in which sinusitis recurs more than 3 times per year, despite other treatments. HOME CARE INSTRUCTIONS   Drink plenty of water. Water helps thin the mucus so your sinuses can drain more easily.  Use a  humidifier.  Inhale steam 3 to 4 times a day (for example, sit in the bathroom with the shower running).  Apply a warm, moist washcloth to your face 3 to 4 times a day, or as directed by your health care provider.  Use saline nasal sprays to help moisten and clean your sinuses.  Take medicines only as directed by your health care provider.  If you were prescribed either an antibiotic or antifungal medicine, finish it all even if  you start to feel better. SEEK IMMEDIATE MEDICAL CARE IF:  You have increasing pain or severe headaches.  You have nausea, vomiting, or drowsiness.  You have swelling around your face.  You have vision problems.  You have a stiff neck.  You have difficulty breathing. MAKE SURE YOU:   Understand these instructions.  Will watch your condition.  Will get help right away if you are not doing well or get worse. Document Released: 05/06/2005 Document Revised: 09/20/2013 Document Reviewed: 05/21/2011 [facility] Patient Information 2015 [place], [place]. This information is not intended to replace advice given to you by your health care provider. Make sure you discuss any questions you have with your health care provider.

## 2014-03-02 ENCOUNTER — Telehealth: Payer: Self-pay

## 2014-03-02 NOTE — Telephone Encounter (Signed)
Pt states she is still dizzy and have an inner ear infection. Would like to have her oow note revised until today instead of yesterday. Please call pt at [phone] Would like to have it faxed to her home with the same phone number

## 2014-03-03 ENCOUNTER — Other Ambulatory Visit: Payer: Self-pay | Admitting: Geriatric Medicine

## 2014-03-03 ENCOUNTER — Encounter: Payer: Self-pay | Admitting: Radiology

## 2014-03-03 MED ORDER — PROPRANOLOL HCL ER 60 MG PO CP24: 60.0000 mg | ORAL_CAPSULE | Freq: Every day | ORAL | Status: DC

## 2014-03-03 NOTE — Telephone Encounter (Signed)
Spoke with patient, wrote note OOW 10/12-10/14/15.  Faxed to patient.  She is back at work today and feeling 40% better.

## 2014-03-08 ENCOUNTER — Other Ambulatory Visit: Payer: Self-pay

## 2014-03-08 MED ORDER — PROPRANOLOL HCL ER 60 MG PO CP24: 60.0000 mg | ORAL_CAPSULE | Freq: Every day | ORAL | Status: DC

## 2014-03-15 ENCOUNTER — Other Ambulatory Visit: Payer: Self-pay | Admitting: Internal Medicine

## 2014-03-24 ENCOUNTER — Other Ambulatory Visit (INDEPENDENT_AMBULATORY_CARE_PROVIDER_SITE_OTHER): Payer: 59

## 2014-03-24 ENCOUNTER — Ambulatory Visit (INDEPENDENT_AMBULATORY_CARE_PROVIDER_SITE_OTHER): Payer: 59 | Admitting: Internal Medicine

## 2014-03-24 ENCOUNTER — Encounter: Payer: Self-pay | Admitting: Internal Medicine

## 2014-03-24 VITALS — BP 108/62 | HR 67 | Temp 98.8°F | Resp 12 | Wt 171.5 lb

## 2014-03-24 DIAGNOSIS — R42 Dizziness and giddiness: Secondary | ICD-10-CM

## 2014-03-24 DIAGNOSIS — I10 Essential (primary) hypertension: Secondary | ICD-10-CM

## 2014-03-24 LAB — BASIC METABOLIC PANEL
BUN: 26 mg/dL — AB (ref 6–23)
CHLORIDE: 101 meq/L (ref 96–112)
CO2: 34 mEq/L — ABNORMAL HIGH (ref 19–32)
Calcium: 9.9 mg/dL (ref 8.4–10.5)
Creatinine, Ser: 0.9 mg/dL (ref 0.4–1.2)
GFR: 79.78 mL/min (ref >60.00)
Glucose, Bld: 109 mg/dL — ABNORMAL HIGH (ref 70–99)
Potassium: 4.3 mEq/L (ref 3.5–5.1)
SODIUM: 143 meq/L (ref 135–145)

## 2014-03-24 MED ORDER — CLONAZEPAM 0.5 MG PO TABS: ORAL_TABLET | ORAL | Status: DC

## 2014-03-24 NOTE — Patient Instructions (Addendum)
The ENT referral will be scheduled and you'll be notified of the time.Please call the Referral Co-Ordinator @ [phone] if you have not been notified of appointment time within 7-10 days.   Drink thin  fluids liberally through the day and chew sugarless gum . Do the Valsalva maneuver several times a day to "pop" ears open. Flonase 1 spray in each nostril twice a day as needed. Use the "crossover" technique as discussed.

## 2014-03-24 NOTE — Progress Notes (Signed)
Subjective:    Patient ID: Rachael Fleming, female    DOB: November 19, 1960, 53 y.o.   MRN: [id number]  HPI   She presents with vertiginous symptoms described as lightheadedness , near syncope and frank vertigo with room spinning intermittently.  Symptoms began 02/21/14 as rhinorrhea.The dizziness started 10/9.  She was seen at the urgent care 02/28/14 and diagnosed with ethmoidal sinusitis. Her symptoms included cough, headache, & nausea that time.She was given generic Omnicef, intranasal Atrovent, guaifenesin, and meclizine. These have not been effective for the vertigo but improved her infectious symptoms. She has residual ear pressure w/o pain, discharge, hearing loss or tinnitus.  The symptoms are worse with light exposure. She also believes it may be affected by repetitive motion activity at work.Her job requires repetitive waist flexion with bending to the right   She does have occasional palpitations. There was no definite neurocardiac prodrome prior to onset of symptoms. She developed the upper respiratory tract symptoms after exposure to her husband who had  similar symptoms.  PMH of similar issue in 1995; full resolution with ? mediaction   She is on HCTZ 25 mg daily; no recent chemistries in the chart.   Review of Systems   Denied were any change in heart rhythm or rate prior to the event. There was no associated chest pain or shortness of breath .  Also specifically denied prior to the episode were headache, limb weakness, tingling, or numbness. No seizure activity noted.      Objective:   Physical Exam   Positive or pertinent findings include: She has marked ophthalmologic sensitivity to light. 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 She also has tatooes of left upper extremity. Supine blood pressure was 110/64 ;sitting 106/64; and standing  102/58.   Gen.: Healthy and well-nourished in appearance. Alert, appropriate and cooperative throughout exam. Appears slightly uncomfortable but in no distress.  Head: Normocephalic without obvious abnormalities Eyes: No corneal or conjunctival inflammation noted. Pupils equal round reactive to light and accommodation. Extraocular motion intact. Field of Vision grossly normal Ears: External  ear exam reveals no significant lesions or deformities. Canals clear .TMs normal. Hearing is grossly normal bilaterally. Tuning fork exam WNL Nose: External nasal exam reveals no deformity or inflammation. Nasal mucosa are pink and moist. No lesions or exudates noted.   Mouth: Oral mucosa and oropharynx reveal no lesions or exudates. Teeth in good repair. Neck: No deformities, masses, or tenderness noted. Range of motion & Thyroid normal. Lungs: Normal respiratory effort; chest expands symmetrically. Lungs are clear to auscultation without rales, wheezes, or increased work of breathing. Heart: Normal rate and rhythm. Normal S1 and S2. No gallop, click, or rub. No murmur. Abdomen: Bowel sounds normal; abdomen soft and nontender. No masses, organomegaly or hernias noted. Genitalia: deferred                                 Musculoskeletal/extremities: No deformity or scoliosis noted of  the thoracic or lumbar spine.  No clubbing, cyanosis, edema, or significant extremity  deformity noted. Range of motion normal .Tone & strength normal. Hand joints normal Fingernail health good. Able to lie down & sit up w/o help. Negative SLR bilaterally Vascular: Carotid, radial artery, dorsalis pedis and  posterior tibial pulses are full and equal. No bruits  present. Neurologic: Alert and oriented x3. Deep tendon reflexes symmetrical and normal.  Gait normal  including heel & toe walking . Rhomberg & finger to nose negative    Skin: Intact without suspicious lesions or rashes. Lymph: No cervical, axillary lymphadenopathy  present. Psych: Mood and affect are normal. Normally interactive                                                                                       Assessment & Plan:  #1 vertigo #2 HTN ; BP actually low Plan:Clonazepam q 8 hrs prn;OOW for 48 hrs to allow effective trial of this medication ENT referral; possible Eustachian tube dysfunction vs inner ear issues BMET because of HCTZ

## 2014-03-24 NOTE — Progress Notes (Signed)
Pre visit review using our clinic review tool, if applicable. No additional management support is needed unless otherwise documented below in the visit note. 

## 2014-03-25 ENCOUNTER — Telehealth: Payer: Self-pay | Admitting: Internal Medicine

## 2014-03-25 ENCOUNTER — Encounter: Payer: 59 | Admitting: Internal Medicine

## 2014-03-25 NOTE — Telephone Encounter (Signed)
emmi emailed °

## 2014-04-03 ENCOUNTER — Observation Stay (HOSPITAL_COMMUNITY)
Admission: EM | Admit: 2014-04-03 | Discharge: 2014-04-04 | Disposition: A | Discharge status: Home / Self Care | Payer: 59 | Attending: Internal Medicine | Admitting: Internal Medicine

## 2014-04-03 ENCOUNTER — Encounter (HOSPITAL_COMMUNITY): Payer: Self-pay

## 2014-04-03 DIAGNOSIS — Z6830 Body mass index (BMI) 30.0-30.9, adult: Secondary | ICD-10-CM | POA: Insufficient documentation

## 2014-04-03 DIAGNOSIS — R42 Dizziness and giddiness: Secondary | ICD-10-CM | POA: Insufficient documentation

## 2014-04-03 DIAGNOSIS — F329 Major depressive disorder, single episode, unspecified: Secondary | ICD-10-CM | POA: Insufficient documentation

## 2014-04-03 DIAGNOSIS — F4310 Post-traumatic stress disorder, unspecified: Secondary | ICD-10-CM | POA: Insufficient documentation

## 2014-04-03 DIAGNOSIS — Z7982 Long term (current) use of aspirin: Secondary | ICD-10-CM | POA: Diagnosis not present

## 2014-04-03 DIAGNOSIS — Z79899 Other long term (current) drug therapy: Secondary | ICD-10-CM | POA: Insufficient documentation

## 2014-04-03 DIAGNOSIS — R55 Syncope and collapse: Principal | ICD-10-CM | POA: Diagnosis present

## 2014-04-03 DIAGNOSIS — K219 Gastro-esophageal reflux disease without esophagitis: Secondary | ICD-10-CM | POA: Insufficient documentation

## 2014-04-03 DIAGNOSIS — H409 Unspecified glaucoma: Secondary | ICD-10-CM | POA: Diagnosis not present

## 2014-04-03 DIAGNOSIS — F419 Anxiety disorder, unspecified: Secondary | ICD-10-CM | POA: Insufficient documentation

## 2014-04-03 DIAGNOSIS — E669 Obesity, unspecified: Secondary | ICD-10-CM | POA: Diagnosis not present

## 2014-04-03 DIAGNOSIS — Z880 Allergy status to penicillin: Secondary | ICD-10-CM | POA: Insufficient documentation

## 2014-04-03 DIAGNOSIS — I10 Essential (primary) hypertension: Secondary | ICD-10-CM | POA: Diagnosis not present

## 2014-04-03 DIAGNOSIS — M1990 Unspecified osteoarthritis, unspecified site: Secondary | ICD-10-CM | POA: Insufficient documentation

## 2014-04-03 DIAGNOSIS — F4312 Post-traumatic stress disorder, chronic: Secondary | ICD-10-CM | POA: Diagnosis present

## 2014-04-03 LAB — I-STAT TROPONIN, ED: Troponin i, poc: 0.01 ng/mL (ref 0.00–0.08)

## 2014-04-03 LAB — TSH: TSH: 0.723 u[IU]/mL (ref 0.350–4.500)

## 2014-04-03 LAB — CBC
HEMATOCRIT: 39 % (ref 36.0–46.0)
Hemoglobin: 12.7 g/dL (ref 12.0–15.0)
MCH: 29.8 pg (ref 26.0–34.0)
MCHC: 32.6 g/dL (ref 30.0–36.0)
MCV: 91.5 fL (ref 78.0–100.0)
Platelets: 194 10*3/uL (ref 150–400)
RBC: 4.26 MIL/uL (ref 3.87–5.11)
RDW: 16.9 % — ABNORMAL HIGH (ref 11.5–15.5)
WBC: 6.3 10*3/uL (ref 4.0–10.5)

## 2014-04-03 LAB — BASIC METABOLIC PANEL
Anion gap: 12 (ref 5–15)
BUN: 21 mg/dL (ref 6–23)
CALCIUM: 9.9 mg/dL (ref 8.4–10.5)
CHLORIDE: 101 meq/L (ref 96–112)
CO2: 28 mEq/L (ref 19–32)
CREATININE: 0.73 mg/dL (ref 0.50–1.10)
GFR calc Af Amer: >90 mL/min (ref >90)
GFR calc non Af Amer: >90 mL/min (ref >90)
GLUCOSE: 93 mg/dL (ref 70–99)
Potassium: 4.4 mEq/L (ref 3.7–5.3)
Sodium: 141 mEq/L (ref 137–147)

## 2014-04-03 MED ORDER — MECLIZINE HCL 25 MG PO TABS
25.0000 mg | ORAL_TABLET | Freq: Once | ORAL | Status: AC
  Administered 2014-04-03: 25 mg via ORAL
  Filled 2014-04-03: qty 1

## 2014-04-03 MED ORDER — ONDANSETRON 4 MG PO TBDP
4.0000 mg | ORAL_TABLET | Freq: Once | ORAL | Status: AC
  Administered 2014-04-03: 4 mg via ORAL
  Filled 2014-04-03: qty 1

## 2014-04-03 MED ORDER — SODIUM CHLORIDE 0.9 % IJ SOLN
3.0000 mL | Freq: Two times a day (BID) | INTRAMUSCULAR | Status: DC
  Administered 2014-04-04: 3 mL via INTRAVENOUS

## 2014-04-03 MED ORDER — ENOXAPARIN SODIUM 40 MG/0.4ML ~~LOC~~ SOLN
40.0000 mg | SUBCUTANEOUS | Status: DC
  Administered 2014-04-03 – 2014-04-04 (×2): 40 mg via SUBCUTANEOUS
  Filled 2014-04-03 (×2): qty 0.4

## 2014-04-03 MED ORDER — SODIUM CHLORIDE 0.9 % IV SOLN
INTRAVENOUS | Status: DC
  Administered 2014-04-03 – 2014-04-04 (×2): via INTRAVENOUS

## 2014-04-03 MED ORDER — TRAZODONE HCL 100 MG PO TABS
100.0000 mg | ORAL_TABLET | Freq: Every day | ORAL | Status: DC
  Administered 2014-04-03: 100 mg via ORAL
  Filled 2014-04-03 (×2): qty 1

## 2014-04-03 MED ORDER — GABAPENTIN 400 MG PO CAPS
1200.0000 mg | ORAL_CAPSULE | Freq: Every day | ORAL | Status: DC
Start: 2014-04-03 — End: 2014-04-05
  Administered 2014-04-03: 1200 mg via ORAL
  Filled 2014-04-03 (×2): qty 3

## 2014-04-03 MED ORDER — ONDANSETRON HCL 4 MG PO TABS
4.0000 mg | ORAL_TABLET | Freq: Four times a day (QID) | ORAL | Status: DC | PRN
  Administered 2014-04-03: 4 mg via ORAL

## 2014-04-03 MED ORDER — ACETAMINOPHEN 325 MG PO TABS: 650.0000 mg | ORAL_TABLET | Freq: Four times a day (QID) | ORAL | Status: DC | PRN

## 2014-04-03 MED ORDER — ACETAMINOPHEN 650 MG RE SUPP: 650.0000 mg | Freq: Four times a day (QID) | RECTAL | Status: DC | PRN

## 2014-04-03 MED ORDER — ASPIRIN EC 81 MG PO TBEC
81.0000 mg | DELAYED_RELEASE_TABLET | Freq: Every day | ORAL | Status: DC
  Administered 2014-04-04: 81 mg via ORAL
  Filled 2014-04-03: qty 1

## 2014-04-03 MED ORDER — TRAMADOL HCL 50 MG PO TABS
50.0000 mg | ORAL_TABLET | Freq: Once | ORAL | Status: AC
  Administered 2014-04-03: 50 mg via ORAL
  Filled 2014-04-03: qty 1

## 2014-04-03 MED ORDER — ONDANSETRON HCL 4 MG/2ML IJ SOLN: 4.0000 mg | Freq: Four times a day (QID) | INTRAMUSCULAR | Status: DC | PRN

## 2014-04-03 NOTE — ED Notes (Signed)
Pt blacked out during church and came too sitting on the floor. C/o hx of vertigo. States before she passed out she felt nauseated and dizzy.

## 2014-04-03 NOTE — ED Notes (Signed)
Multiple family members at bedside. Pt ate 50% of [place] sandwich states she feels slightly nauseated after eating

## 2014-04-03 NOTE — ED Notes (Signed)
MD at bedside. 

## 2014-04-03 NOTE — H&P (Signed)
History and Physical  [name] URK:[id number] DOB: [birth year] DOA: 04/03/2014  Referring physician: Vivi Martens, ER physician PCP: Olga Millers, MD   Chief Complaint: syncopal episodes  HPI: Rachael Fleming is a 53 y.o. female  Past medical history of hypertension on beta blocker plus PTSD who presented to the emergency room after a syncopal episode at church. Patient's issue started approximately 8 days ago when she was up and cleaning at home when all of a sudden she felt lightheaded-no spinning sensation, just lightheaded.  Symptoms persisted, so patient went to take a shower. Following the shower, patient actually passed out in the bathroom. Husband came and found her and was easily able to wake her up. There was no confusion after. Patient did not have any tongue biting or loss of bowel/bladder. Since that time, patient has had feelings of lightheadedness whenever standing or with activity, improved with sitting. She went to go see her primary care physician and was seen by her covering partner, Dr. Linna Darner. Patient admits that she did not want to make too big of a fuss and simply told Dr. Linna Darner that she was lightheaded and dizzy, but did not actually tell him that she passed out. Patient was evaluated as vertigo and then referred to ENT who she saw a few days later who recommended monitoring. Patient's lightheadedness symptoms persisted through the course of the week and then today at church, she was standing up for a few minutes for prior and she felt the lightheadedness again. She states that she should've sat down, but didn't and then passed out. Husband was the next to her and feels like she was out for about 3-5 minutes, which after which she woke up without any confusion. He states he didn't take that long to wake her up. Again, no reported jerking-like movements, loss of bowel or bladder. Paramedics were called and patient was brought into the hospital. Lab work was done  which was unremarkable. hospitalists were called for further evaluation and admission   Review of Systems:  Patient seen in emergency room Pt complains of fatigue, patient states in the last week or so she found herself much more tired than usual.no change in her sleep habits.  Pt denies any headaches, vision changes, dysphagia, chest pain, palpitations, shortness of breath, wheeze, cough, abdominal pain, hematuria, dysuria, constipation diarrhea, focal extremity numbness or weakness or pain..  Review of systems are otherwise negative  Past Medical History  Diagnosis Date  . DEPRESSION     PTSD complication, working at [employer]  . GLAUCOMA   . HYPERTENSION   . GERD   . ARTHRITIS   . Migraine   . Anxiety   . Abnormal Pap smear 1984    Cryo  . Heart palpitations 03/2013   Past Surgical History  Procedure Laterality Date  . Cesarean section  1990  . Bilateral breast reduction  1998  . Skin graft  2006  . Bladder tack  2008  . Abdominal hysterectomy  2008    TVH  . L3-5 decompression  06/2009  . Breast surgery    . Spine surgery    . Tubal ligation  1/90  . Cervix lesion destruction  1984  . Shoulder surgery Right    Social History:  reports that she has never smoked. She has never used smokeless tobacco. She reports that she drinks about 1.0 oz of alcohol per week. She reports that she does not use illicit drugs. Patient lives at home with  her husband & is able to participate in activities of daily living without assistance, she is quite active  Allergies  Allergen Reactions  . Penicillins     REACTION: Nausea \\T \ vomitting    Family History  Problem Relation Age of Onset  . Arthritis Mother   . Diabetes Mother   . Dementia Mother   . Stroke Mother   . Hypertension Mother   . Alzheimer's disease Mother   . Arthritis Father   . Cancer Father     prostate, metastatic turned into bone  . Stroke Father   . Hypertension Father   . Alcohol abuse Sister      committed suicide  . Diabetes Maternal Grandmother   . Cancer Maternal Grandfather   . Cancer Paternal Grandmother   . Cancer Paternal Grandfather   . Coronary artery disease  1    Grandfather with MI    Reviewed with patient  Prior to Admission medications   Medication Sig Start Date End Date Taking? Authorizing Provider  aspirin EC 81 MG tablet Take 81 mg by mouth daily.   Yes Historical Provider, MD  gabapentin (NEURONTIN) 400 MG capsule Take 1,200 mg by mouth at bedtime.  08/05/13  Yes Historical Provider, MD  ipratropium (ATROVENT) 0.03 % nasal spray Place 2 sprays into the nose 4 (four) times daily. Patient taking differently: Place 2 sprays into the nose daily as needed for rhinitis.  02/28/14  Yes Shawnee Knapp, MD  meclizine (ANTIVERT) 25 MG tablet Take 1 tablet (25 mg total) by mouth 3 (three) times daily as needed for dizziness. 02/28/14  Yes Shawnee Knapp, MD  Multiple Vitamins-Minerals (MULTIVITAMIN & MINERAL PO) Take by mouth daily. Alive 50+ MVI.   Yes Historical Provider, MD  propranolol ER (INDERAL LA) 60 MG 24 hr capsule TAKE 1 CAPSULE (60 MG TOTAL) BY MOUTH DAILY. 03/15/14  Yes Olga Millers, MD  traZODone (DESYREL) 50 MG tablet Take 100 mg by mouth at bedtime. 100-200mg    Yes Historical Provider, MD  Vilazodone HCl (VIIBRYD) 40 MG TABS Take 40 mg by mouth daily.    Yes Historical Provider, MD    Physical Exam: BP 127/76 mmHg  Pulse 57  Temp(Src) 98.6 F (37 C) (Oral)  Resp 16  Ht 5\' 1"  (1.549 m)  Wt 78.472 kg (173 lb)  BMI 32.70 kg/m2  SpO2 98%  LMP 12/18/2005  General:  Alert and oriented 3, no acute distress Eyes: sclera nonicteric, extraocular movements are intact ENT: normocephalic, atraumatic, mucous members are slightly dry Neck: no JVD, mild thyroid fullness Cardiovascular: regular rate and rhythm, S1-S2, no murmurs, borderline bradycardia Respiratory: clear to auscultation bilaterally Abdomen: soft, nontender, nondistended, positive bowel  sounds Skin: no skin breaks, tears or lesions, tattoos noted on both arms Musculoskeletal: no clubbing or cyanosis or edema, strength is symmetric and equal Psychiatric: patient is appropriate, no evidence of psychoses Neurologic: no focal findings. Cranial nerves II through XII are intact. Downgoing toes. No dysmetria          Labs on Admission:  Basic Metabolic Panel:  Recent Labs Lab 04/03/14 1304  NA 141  K 4.4  CL 101  CO2 28  GLUCOSE 93  BUN 21  CREATININE 0.73  CALCIUM 9.9   CBC:  Recent Labs Lab 04/03/14 1304  WBC 6.3  HGB 12.7  HCT 39.0  MCV 91.5  PLT 194    EKG: Independently reviewed. Normal sinus rhythm with nonspecific ST elevated elevations in lead 1 and aVL  Assessment/Plan Present on Admission:  . Unspecified essential hypertension: Blood pressure stable. See below. Holding propranolol. Note, HCTZ listed, which patient is no longer on.  . Syncope: Principal problem. Unclear etiology, although given history of lightheadedness notably when standing or active and then symptoms improved with sitting, I suspect orthostatic hypotension, noting patient is on beta blocker. Granted she has been on beta blocker for some time, so not exactly clear why she would suddenly start to have a problem. In addition, according to husband, who seems reliable, patient was out for 4-5 minutes which seems slightly longer than expected for vasovagal. No post ictal, so do not think seizure. No focal neurological findings, so doubt anything neurologic. Would plan to hydrate, hold beta blocker and Neurontin. Check orthostatics in the morning. If patient has improved, could discharge home tomorrow. She has had a sleep study about 4 years ago which ruled out sleep apnea next line. Note EKG unrevealing, but did have some nonspecific ST elevations in 1 and aVL, so will repeat EKG in the morning. Will also check TSH  . Obesity (BMI 30-39.9): Patient meets criteria with BMI greater than  30.  Marland Kitchen Chronic post-traumatic stress disorder (PTSD): On antidepressant. Also takes 1200 mg Neurontin at night for this.? If this is making her more drowsy during the daytime. We'll hold tonight's dose  Consultants: none  Code Status: full code  Family Communication: husband at the bedside   Disposition Plan: possibly home tomorrow once workup completed and hopefully symptoms better  Time spent: 35 minutes  [facility] Pager [phone]

## 2014-04-03 NOTE — ED Notes (Signed)
Report from donna, rn.  Pt care assumed

## 2014-04-03 NOTE — ED Provider Notes (Signed)
CSN: [id number]     Arrival date & time 04/03/14  1250 History   First MD Initiated Contact with Patient 04/03/14 1315     Chief Complaint  Patient presents with  . Loss of Consciousness     (Consider location/radiation/quality/duration/timing/severity/associated sxs/prior Treatment) HPI Comments: Patient here complaining of being dizzy and lightheaded while she was standing up in church today. Does have a history of vertigo and states that this is similar. She was helped to the ground and there was no reported seizure activity. She denies any recent illnesses with the exception of a recent URI with sinusitis. Denies any chest pain, short of breath, abdominal pain. No black or bloody stools. No fever or chills. Symptoms are better with sitting. And was moving her head. Denies any weakness to her arms or legs. No confusion or slurred speech. Denies any ataxia.  Patient is a 53 y.o. female presenting with syncope. The history is provided by the patient.  Loss of Consciousness   Past Medical History  Diagnosis Date  . DEPRESSION     PTSD complication, working at [employer]  . GLAUCOMA   . HYPERTENSION   . GERD   . ARTHRITIS   . Migraine   . Anxiety   . Abnormal Pap smear 1984    [facility]  . Heart palpitations 03/2013   Past Surgical History  Procedure Laterality Date  . Cesarean section  1990  . Bilateral breast reduction  1998  . Skin graft  2006  . Bladder tack  2008  . Abdominal hysterectomy  2008    [facility]  . L3-5 decompression  06/2009  . Breast surgery    . Spine surgery    . Tubal ligation  1/90  . Cervix lesion destruction  1984  . Shoulder surgery Right    Family History  Problem Relation Age of Onset  . Arthritis Mother   . Diabetes Mother   . Dementia Mother   . Stroke Mother   . Hypertension Mother   . Alzheimer's disease Mother   . Arthritis Father   . Cancer Father     prostate, metastatic turned into bone  . Stroke Father   . Hypertension Father   .  Alcohol abuse Sister     committed suicide  . Diabetes Maternal Grandmother   . Cancer Maternal Grandfather   . Cancer Paternal Grandmother   . Cancer Paternal Grandfather   . Coronary artery disease  49    Grandfather with MI   History  Substance Use Topics  . Smoking status: Never Smoker   . 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 employed @ [employer] 3rd shift  . Alcohol Use: 1.0 oz/week    2 drink(s) per week     Comment: Social   OB History    Gravida Para Term Preterm AB TAB SAB Ectopic Multiple Living   4 2 2  2     2      Review of Systems  Cardiovascular: Positive for syncope.  All other systems reviewed and are negative.     Allergies  Penicillins  Home Medications   Prior to Admission medications   Medication Sig Start Date End Date Taking? Authorizing Provider  aspirin EC 81 MG tablet Take 81 mg by mouth daily.    Historical Provider, MD  cefdinir (OMNICEF) 300 MG capsule Take 1 capsule (300 mg total) by mouth 2 (two) times daily. 02/28/14   Shawnee Knapp, MD  clonazePAM (KLONOPIN) 0.5 MG tablet 1/2 -1 every 8 hrs prn vertigo 03/24/14   Hendricks Limes, MD  gabapentin (NEURONTIN) 400 MG capsule Take 1,200 mg by mouth at bedtime.  08/05/13   Historical Provider, MD  Guaifenesin (MUCINEX MAXIMUM STRENGTH) 1200 MG TB12 Take 1 tablet (1,200 mg total) by mouth every 12 (twelve) hours as needed. 02/28/14   Shawnee Knapp, MD  hydrochlorothiazide (HYDRODIURIL) 25 MG tablet TAKE 1 TABLET (25 MG TOTAL) BY MOUTH DAILY. 01/26/14   Rowe Clack, MD  ipratropium (ATROVENT) 0.03 % nasal spray Place 2 sprays into the nose 4 (four) times daily. 02/28/14   Shawnee Knapp, MD  meclizine (ANTIVERT) 25 MG tablet Take 1 tablet (25 mg total) by mouth 3 (three) times daily as needed for dizziness. 02/28/14   Shawnee Knapp, MD  meloxicam (MOBIC) 15 MG tablet Take 1 tablet (15 mg total) by mouth daily. 09/05/13   Eleanore Kurtis Bushman, PA-C  Multiple Vitamins-Minerals  (MULTIVITAMIN & MINERAL PO) Take by mouth daily. Alive 50+ MVI.    Historical Provider, MD  propranolol ER (INDERAL LA) 60 MG 24 hr capsule TAKE 1 CAPSULE (60 MG TOTAL) BY MOUTH DAILY. 03/15/14   Olga Millers, MD  traZODone (DESYREL) 50 MG tablet Take 100 mg by mouth at bedtime. 100-200mg     Historical Provider, MD  Vilazodone HCl (VIIBRYD) 40 MG TABS Take 40 mg by mouth daily.     Historical Provider, MD   BP 133/80 mmHg  Pulse 70  Temp(Src) 98.6 F (37 C) (Oral)  Resp 20  Ht 5\' 1"  (1.549 m)  Wt 173 lb (78.472 kg)  BMI 32.70 kg/m2  SpO2 96%  LMP 12/18/2005 Physical Exam  Constitutional: She is oriented to person, place, and time. She appears well-developed and well-nourished.  Non-toxic appearance. No distress.  HENT:  Head: Normocephalic and atraumatic.  Eyes: Conjunctivae, EOM and lids are normal. Pupils are equal, round, and reactive to light.  Neck: Normal range of motion. Neck supple. No tracheal deviation present. No thyroid mass present.  Cardiovascular: Normal rate, regular rhythm and normal heart sounds.  Exam reveals no gallop.   No murmur heard. Pulmonary/Chest: Effort normal and breath sounds normal. No stridor. No respiratory distress. She has no decreased breath sounds. She has no wheezes. She has no rhonchi. She has no rales.  Abdominal: Soft. Normal appearance and bowel sounds are normal. She exhibits no distension. There is no tenderness. There is no rebound and no CVA tenderness.  Musculoskeletal: Normal range of motion. She exhibits no edema or tenderness.  Neurological: She is alert and oriented to person, place, and time. She has normal strength. No cranial nerve deficit or sensory deficit. Coordination normal. GCS eye subscore is 4. GCS verbal subscore is 5. GCS motor subscore is 6.  Horizontal nystagmus noted  Skin: Skin is warm and dry. No abrasion and no rash noted.  Psychiatric: She has a normal mood and affect. Her speech is normal and behavior is  normal.  Nursing note and vitals reviewed.   ED Course  Procedures (including critical care time) Labs Review Labs Reviewed  CBC - Abnormal; Notable for the following:    RDW 16.9 (*)    All other components within normal limits  BASIC METABOLIC PANEL  I-STAT TROPOININ, ED    Imaging Review No results found.   EKG Interpretation   Date/Time:  Sunday April 03 2014 12:59:55 EST Ventricular Rate:  66 PR Interval:  154 QRS Duration: 84  QT Interval:  404 QTC Calculation: 423 R Axis:   68 Text Interpretation:  Normal sinus rhythm ST elevation, consider early  repolarization, pericarditis, or injury Nonspecific T wave abnormality  Abnormal ECG No significant change since last tracing Confirmed by RAY MD,  Andee Poles [phone]) on 04/03/2014 1:08:24 PM      MDM   Final diagnoses:  None    Spoke with patient at length and she now relates that she had a syncopal episode one week ago. With this being her second episode of syncope she will require inpatient admission for evaluation. Discussed with [facility]ad hospitalist and patient to be admitted    Leota Jacobsen, MD 04/03/14 1536

## 2014-04-04 ENCOUNTER — Other Ambulatory Visit: Payer: Self-pay

## 2014-04-04 ENCOUNTER — Observation Stay (HOSPITAL_COMMUNITY): Payer: 59

## 2014-04-04 DIAGNOSIS — I10 Essential (primary) hypertension: Secondary | ICD-10-CM | POA: Diagnosis not present

## 2014-04-04 DIAGNOSIS — F329 Major depressive disorder, single episode, unspecified: Secondary | ICD-10-CM | POA: Diagnosis not present

## 2014-04-04 DIAGNOSIS — R42 Dizziness and giddiness: Secondary | ICD-10-CM | POA: Diagnosis not present

## 2014-04-04 DIAGNOSIS — R55 Syncope and collapse: Secondary | ICD-10-CM | POA: Diagnosis not present

## 2014-04-04 LAB — BASIC METABOLIC PANEL
Anion gap: 11 (ref 5–15)
BUN: 19 mg/dL (ref 6–23)
CO2: 28 meq/L (ref 19–32)
Calcium: 9.3 mg/dL (ref 8.4–10.5)
Chloride: 105 mEq/L (ref 96–112)
Creatinine, Ser: 1.05 mg/dL (ref 0.50–1.10)
GFR calc Af Amer: 68 mL/min — ABNORMAL LOW (ref >90)
GFR, EST NON AFRICAN AMERICAN: 59 mL/min — AB (ref >90)
GLUCOSE: 87 mg/dL (ref 70–99)
POTASSIUM: 4.3 meq/L (ref 3.7–5.3)
Sodium: 144 mEq/L (ref 137–147)

## 2014-04-04 LAB — CBC
HEMATOCRIT: 39.2 % (ref 36.0–46.0)
HEMOGLOBIN: 12.5 g/dL (ref 12.0–15.0)
MCH: 29.6 pg (ref 26.0–34.0)
MCHC: 31.9 g/dL (ref 30.0–36.0)
MCV: 92.7 fL (ref 78.0–100.0)
PLATELETS: 190 10*3/uL (ref 150–400)
RBC: 4.23 MIL/uL (ref 3.87–5.11)
RDW: 17.3 % — ABNORMAL HIGH (ref 11.5–15.5)
WBC: 4.9 10*3/uL (ref 4.0–10.5)

## 2014-04-04 LAB — TROPONIN I

## 2014-04-04 LAB — D-DIMER, QUANTITATIVE (NOT AT ARMC): D DIMER QUANT: 0.29 ug{FEU}/mL (ref 0.00–0.48)

## 2014-04-04 LAB — GLUCOSE, CAPILLARY: Glucose-Capillary: 86 mg/dL (ref 70–99)

## 2014-04-04 MED ORDER — SCOPOLAMINE 1 MG/3DAYS TD PT72
1.0000 | MEDICATED_PATCH | TRANSDERMAL | Status: DC
  Administered 2014-04-04: 1.5 mg via TRANSDERMAL
  Filled 2014-04-04: qty 1

## 2014-04-04 MED ORDER — PROMETHAZINE HCL 25 MG/ML IJ SOLN
25.0000 mg | Freq: Three times a day (TID) | INTRAMUSCULAR | Status: DC | PRN
  Administered 2014-04-04: 25 mg via INTRAVENOUS
  Filled 2014-04-04: qty 1

## 2014-04-04 MED ORDER — TRAMADOL HCL 50 MG PO TABS
50.0000 mg | ORAL_TABLET | Freq: Four times a day (QID) | ORAL | Status: DC | PRN
  Administered 2014-04-04: 50 mg via ORAL
  Filled 2014-04-04: qty 1

## 2014-04-04 MED ORDER — SODIUM CHLORIDE 0.9 % IV BOLUS (SEPSIS)
250.0000 mL | Freq: Once | INTRAVENOUS | Status: AC
  Administered 2014-04-04: 250 mL via INTRAVENOUS

## 2014-04-04 MED ORDER — HYDROMORPHONE HCL 1 MG/ML IJ SOLN
1.0000 mg | Freq: Once | INTRAMUSCULAR | Status: AC
  Administered 2014-04-04: 1 mg via INTRAVENOUS
  Filled 2014-04-04: qty 1

## 2014-04-04 MED ORDER — TRAMADOL HCL 50 MG PO TABS
50.0000 mg | ORAL_TABLET | Freq: Once | ORAL | Status: AC
Start: 2014-04-04 — End: 2014-04-04
  Administered 2014-04-04: 50 mg via ORAL
  Filled 2014-04-04: qty 1

## 2014-04-04 MED ORDER — SCOPOLAMINE 1 MG/3DAYS TD PT72: 1.0000 | MEDICATED_PATCH | TRANSDERMAL | Status: DC

## 2014-04-04 NOTE — Discharge Summary (Addendum)
Physician Discharge Summary  [name] MRN: [id number] DOB/AGE: 22-Nov-1960 53 y.o.  PCP: Olga Millers, MD   Admit date: 04/03/2014 Discharge date: 04/04/2014  Discharge Diagnoses:     Orthostatic syncope Peripheral vertigo   Unspecified essential hypertension   Obesity (BMI 30-39.9)   Chronic post-traumatic stress disorder (PTSD)  Follow-up recommendations Follow-up with PCP in 5-7 days PCP to review patient's dose of propranolol and continuation of vibrid     Medication List    TAKE these medications        aspirin EC 81 MG tablet  Take 81 mg by mouth daily.     gabapentin 400 MG capsule  Commonly known as:  NEURONTIN  Take 1,200 mg by mouth at bedtime.     ipratropium 0.03 % nasal spray  Commonly known as:  ATROVENT  Place 2 sprays into the nose 4 (four) times daily.     meclizine 25 MG tablet  Commonly known as:  ANTIVERT  Take 1 tablet (25 mg total) by mouth 3 (three) times daily as needed for dizziness.     MULTIVITAMIN & MINERAL PO  Take by mouth daily. Alive 50+ MVI.     propranolol ER 60 MG 24 hr capsule  Commonly known as:  INDERAL LA  TAKE 1 CAPSULE (60 MG TOTAL) BY MOUTH DAILY.     scopolamine 1 MG/3DAYS  Commonly known as:  TRANSDERM-SCOP  Place 1 patch (1.5 mg total) onto the skin every 3 (three) days.     traZODone 50 MG tablet  Commonly known as:  DESYREL  Take 100 mg by mouth at bedtime. 100-$RemoveBeforeD'200mg'[id number]$      VIIBRYD 40 MG Tabs  Generic drug:  Vilazodone HCl  Take 40 mg by mouth daily.        Discharge Condition: Stable  Disposition: 01-Home or Self Care Outpatient physical therapy   Consults: none   Significant Diagnostic Studies: No results found. none   Microbiology: No results found for this or any previous visit (from the past 240 hour(s)).   Labs: Results for orders placed or performed during the hospital encounter of 04/03/14 (from the past 48 hour(s))  CBC     Status: Abnormal   Collection  Time: 04/03/14  1:04 PM  Result Value Ref Range   WBC 6.3 4.0 - 10.5 K/uL   RBC 4.26 3.87 - 5.11 MIL/uL   Hemoglobin 12.7 12.0 - 15.0 g/dL   HCT 39.0 36.0 - 46.0 %   MCV 91.5 78.0 - 100.0 fL   MCH 29.8 26.0 - 34.0 pg   MCHC 32.6 30.0 - 36.0 g/dL   RDW 16.9 (H) 11.5 - 15.5 %   Platelets 194 150 - 400 K/uL  Basic metabolic panel     Status: None   Collection Time: 04/03/14  1:04 PM  Result Value Ref Range   Sodium 141 137 - 147 mEq/L   Potassium 4.4 3.7 - 5.3 mEq/L   Chloride 101 96 - 112 mEq/L   CO2 28 19 - 32 mEq/L   Glucose, Bld 93 70 - 99 mg/dL   BUN 21 6 - 23 mg/dL   Creatinine, Ser 0.73 0.50 - 1.10 mg/dL   Calcium 9.9 8.4 - 10.5 mg/dL   GFR calc non Af Amer >90 >90 mL/min   GFR calc Af Amer >90 >90 mL/min    Comment: (NOTE) The eGFR has been calculated using the CKD EPI equation. This calculation has not been validated in all clinical situations. eGFR's  persistently <90 mL/min signify possible Chronic Kidney Disease.    Anion gap 12 5 - 15  I-stat troponin, ED (not at [facility])     Status: None   Collection Time: 04/03/14  1:19 PM  Result Value Ref Range   Troponin i, poc 0.01 0.00 - 0.08 ng/mL   Comment 3            Comment: Due to the release kinetics of cTnI, a negative result within the first hours of the onset of symptoms does not rule out myocardial infarction with certainty. If myocardial infarction is still suspected, repeat the test at appropriate intervals.   TSH     Status: None   Collection Time: 04/03/14  6:28 PM  Result Value Ref Range   TSH 0.723 0.350 - 4.500 uIU/mL  Basic metabolic panel     Status: Abnormal   Collection Time: 04/04/14  4:10 AM  Result Value Ref Range   Sodium 144 137 - 147 mEq/L   Potassium 4.3 3.7 - 5.3 mEq/L   Chloride 105 96 - 112 mEq/L   CO2 28 19 - 32 mEq/L   Glucose, Bld 87 70 - 99 mg/dL   BUN 19 6 - 23 mg/dL   Creatinine, Ser 1.05 0.50 - 1.10 mg/dL   Calcium 9.3 8.4 - 10.5 mg/dL   GFR calc non Af Amer 59 (L) >90 mL/min    GFR calc Af Amer 68 (L) >90 mL/min    Comment: (NOTE) The eGFR has been calculated using the CKD EPI equation. This calculation has not been validated in all clinical situations. eGFR's persistently <90 mL/min signify possible Chronic Kidney Disease.    Anion gap 11 5 - 15  CBC     Status: Abnormal   Collection Time: 04/04/14  4:10 AM  Result Value Ref Range   WBC 4.9 4.0 - 10.5 K/uL   RBC 4.23 3.87 - 5.11 MIL/uL   Hemoglobin 12.5 12.0 - 15.0 g/dL   HCT 39.2 36.0 - 46.0 %   MCV 92.7 78.0 - 100.0 fL   MCH 29.6 26.0 - 34.0 pg   MCHC 31.9 30.0 - 36.0 g/dL   RDW 17.3 (H) 11.5 - 15.5 %   Platelets 190 150 - 400 K/uL  Glucose, capillary     Status: None   Collection Time: 04/04/14  4:33 AM  Result Value Ref Range   Glucose-Capillary 86 70 - 99 mg/dL  Troponin I     Status: None   Collection Time: 04/04/14  9:18 AM  Result Value Ref Range   Troponin I <0.30 <0.30 ng/mL    Comment:        Due to the release kinetics of cTnI, a negative result within the first hours of the onset of symptoms does not rule out myocardial infarction with certainty. If myocardial infarction is still suspected, repeat the test at appropriate intervals.   D-dimer, quantitative     Status: None   Collection Time: 04/04/14  9:18 AM  Result Value Ref Range   D-Dimer, Quant 0.29 0.00 - 0.48 ug/mL-FEU    Comment:        AT THE INHOUSE ESTABLISHED CUTOFF VALUE OF 0.48 ug/mL FEU, THIS ASSAY HAS BEEN DOCUMENTED IN THE LITERATURE TO HAVE A SENSITIVITY AND NEGATIVE PREDICTIVE VALUE OF AT LEAST 98 TO 99%.  THE TEST RESULT SHOULD BE CORRELATED WITH AN ASSESSMENT OF THE CLINICAL PROBABILITY OF DVT / VTE.      HPI :Rachael Fleming is a 53  y.o. female  Past medical history of hypertension on beta blocker plus PTSD who presented to the emergency room after a syncopal episode at church. Patient's issue started approximately 8 days ago when she was up and cleaning at home when all of a sudden she felt  lightheaded-no spinning sensation, just lightheaded. Symptoms persisted, so patient went to take a shower. Following the shower, patient actually passed out in the bathroom. Husband came and found her and was easily able to wake her up. There was no confusion after. Patient did not have any tongue biting or loss of bowel/bladder. Since that time, patient has had feelings of lightheadedness whenever standing or with activity, improved with sitting. She went to go see her primary care physician and was seen by her covering partner, Dr. Linna Darner. Patient admits that she did not want to make too big of a fuss and simply told Dr. Linna Darner that she was lightheaded and dizzy, but did not actually tell him that she passed out. Patient was evaluated as vertigo and then referred to ENT who she saw a few days later who recommended monitoring. Patient's lightheadedness symptoms persisted through the course of the week and then today at church, she was standing up for a few minutes for prior and she felt the lightheadedness again. She states that she should've sat down, but didn't and then passed out. Husband was the next to her and feels like she was out for about 3-5 minutes, which after which she woke up without any confusion. He states he didn't take that long to wake her up. Again, no reported jerking-like movements, loss of bowel or bladder. Paramedics were called and patient was brought into the hospital. Lab work was done which was unremarkable. hospitalists were called for further evaluation and admission   HOSPITAL COURSE:  Syncope vs peripheral vertigo Orthostatics positive, patient admitted and hydrated with IV fluids Recent URI, with left ear tinnitus She was seen at the urgent care 02/28/14 and diagnosed with ethmoidal sinusitis. Her symptoms included cough, headache, & nausea that time.She was given generic Omnicef, intranasal Atrovent, guaifenesin, and meclizine. These have not been effective for the vertigo  but improved her infectious symptoms. She has residual ear pressure w/o pain, complains of left ear ringing She received a recent ENT referral from her PCP She will benefit from ENT evaluation Patient currently on meclizine, continue Also started on of scopolamine patch Telemetry uneventfully from cardiac enzymes cycled and negative EKG shows normal sinus rhythm Patient may benefit from outpatient vestibular rehabilitation, will arrange 2-D echo ordered and pending  History of palpitations Patient on propranolol, dose may need to be decreased as this may be contribution to her orthostatic hypotension   History of migraine headaches/PTSD Patient on Vibrid and gabapentin Vibrid can also cause headaches and dizziness Recommend PCP review home medications MRI of the brain pending    Discharge Exam:   Blood pressure 103/68, pulse 68, temperature 98.2 F (36.8 C), temperature source Oral, resp. rate 18, height $RemoveBe'5\' 1"'BCsWTZtHd$  (1.549 m), weight 78.9 kg (173 lb 15.1 oz), last menstrual period 12/18/2005, SpO2 100 %.   Eyes: sclera nonicteric, extraocular movements are intact  ENT: normocephalic, atraumatic, mucous members are slightly dry  Neck: no JVD, mild thyroid fullness  Cardiovascular: regular rate and rhythm, S1-S2, no murmurs, borderline bradycardia  Respiratory: clear to auscultation bilaterally  Abdomen: soft, nontender, nondistended, positive bowel sounds  Skin: no skin breaks, tears or lesions, tattoos noted on both arms  Musculoskeletal: no clubbing or  cyanosis or edema, strength is symmetric and equal  Psychiatric: patient is appropriate, no evidence of psychoses  Neurologic: no focal findings. Cranial nerves II through XII are intact. Downgoing toes         Follow-up Information    Follow up with Olga Millers, MD. Schedule an appointment as soon as possible for a visit in 1 week.   Specialty:  Internal Medicine   Contact information:   [place] [id number] [phone]       Signed: Reyne Dumas 04/04/2014, 11:54 AM

## 2014-04-04 NOTE — Care Management Note (Signed)
    Page 1 of 1   04/04/2014     2:57:24 PM CARE MANAGEMENT NOTE 04/04/2014  Patient:  Rachael Fleming, Rachael Fleming   Account Number:  [id number]  Date Initiated:  04/04/2014  Documentation initiated by:  Marvetta Gibbons  Subjective/Objective Assessment:   Pt admitted with syncope     Action/Plan:   PTA pt lived at home- PT eval   Anticipated DC Date:  04/04/2014   Anticipated DC Plan:  [place]  CM consult  OP Neuro Rehab      Choice offered to / List presented to:             Status of service:  Completed, signed off Medicare Important Message given?  NO (If response is "NO", the following Medicare IM given date fields will be blank) Date Medicare IM given:   Medicare IM given by:   Date Additional Medicare IM given:   Additional Medicare IM given by:    Discharge Disposition:  HOME/SELF CARE  Per UR Regulation:  Reviewed for med. necessity/level of care/duration of stay  If discussed at [place] of Stay Meetings, dates discussed:    Comments:  04/04/14- 1445- Marvetta Gibbons RN, BSN [phone] Referral for outpt Vestibular rehab- referral sent to [facility] re[facility] for Vestibular PT.

## 2014-04-04 NOTE — Evaluation (Signed)
Physical Therapy Evaluation Patient Details Name: Rachael Fleming MRN: [id number] DOB: [birth year] Today's Date: 04/04/2014   History of Present Illness  Past medical history of hypertension on beta blocker plus PTSD who presented to the emergency room after a syncopal episode at church. Patient's issue started approximately 8 days ago when she was up and cleaning at home when all of a sudden she felt lightheaded-no spinning sensation, just lightheaded.  Symptoms persisted, so patient went to take a shower. Following the shower, patient actually passed out in the bathroom. Husband came and found her and was easily able to wake her up.   Clinical Impression  Pt moving well and reports nausea with movement with right sidely to sit and sit to sidely left. Performed supine head roll without nystagmus or reproduction of symptoms. With [employer] bil negative but postiive nausea to left. Treated pt with Epley maneuver with education for no rapid head movement of bed flat for 24hrs. Pt able to perform visual tracking without difficulty, head thrust and saccades. Pt did report some feeling of uneasiness with rapid shift in visual tracking. Pt with slight balance deficits with challenges which are not normal for pt. Pt will benefit from continued acute therapy as well as OPPT vestibular follow up to maximize independence, decrease nausea and dizziness to return pt to [place].    Follow Up Recommendations Outpatient PT (vestbular if symptoms persist after acute setting)    Equipment Recommendations  None recommended by PT    Recommendations for Other Services       Precautions / Restrictions Precautions Precautions: None Restrictions Weight Bearing Restrictions: No      Mobility  Bed Mobility Overal bed mobility: Modified Independent                Transfers Overall transfer level: Modified independent                  Ambulation/Gait Ambulation/Gait assistance:  Supervision Ambulation Distance (Feet): 350 Feet Assistive device: None Gait Pattern/deviations: WFL(Within Functional Limits)   Gait velocity interpretation: at or above normal speed for age/gender General Gait Details: with head turns pt with veering right x 1 and with decreased gait speed LOB x 1 as well.   Stairs            Wheelchair Mobility    Modified Rankin (Stroke Patients Only)       Balance Overall balance assessment: Needs assistance   Sitting balance-Leahy Scale: Normal       Standing balance-Leahy Scale: Good                               Pertinent Vitals/Pain Pain Assessment: No/denies pain    Home Living Family/patient expects to be discharged to:: Private residence Living Arrangements: Spouse/significant other;Children Available Help at Discharge: Family;Available PRN/intermittently Type of Home: House Home Access: Stairs to enter Entrance Stairs-Rails: None Entrance Stairs-Number of Steps: 4 Home Layout: One level Home Equipment: None      Prior Function Level of Independence: Independent         Comments: pt works in a Psychologist, educational job, 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 with Aspergers     Hand Dominance        Extremity/Trunk Assessment   Upper Extremity Assessment: Overall WFL for tasks assessed           Lower Extremity Assessment: Overall WFL for tasks assessed  Cervical / Trunk Assessment: Normal  Communication   Communication: No difficulties  Cognition Arousal/Alertness: Awake/alert Behavior During Therapy: WFL for tasks assessed/performed Overall Cognitive Status: Within Functional Limits for tasks assessed                      General Comments      Exercises        Assessment/Plan    PT Assessment Patient needs continued PT services  PT Diagnosis Abnormality of gait   PT Problem List Decreased activity tolerance;Decreased balance  PT Treatment Interventions  Functional mobility training;Therapeutic activities;Other (comment) (vestibular treatment)   PT Goals (Current goals can be found in the Care Plan section) Acute Rehab PT Goals Patient Stated Goal: return to [place] PT Goal Formulation: With patient Time For Goal Achievement: 04/11/14 Potential to Achieve Goals: Good Additional Goals Additional Goal #1: Pt will perform all tasks without nausea or dizziness including side to sit bilaterally    Frequency Min 3X/week   Barriers to discharge        Co-evaluation               End of Session   Activity Tolerance: Patient tolerated treatment well Patient left: in chair;with call bell/phone within reach      Functional Assessment Tool Used: clinical judgement Functional Limitation: Mobility: Walking and moving around Mobility: Walking and Moving Around Current Status (C5885): At least 1 percent but less than 20 percent impaired, limited or restricted Mobility: Walking and Moving Around Goal Status [phone]): 0 percent impaired, limited or restricted    Time: 1006-1041 PT Time Calculation (min) (ACUTE ONLY): 35 min   Charges:   PT Evaluation $Initial PT Evaluation Tier I: 1 Procedure PT Treatments $[place] Rep Proc: 8-22 mins   PT G Codes:   Functional Assessment Tool Used: clinical judgement Functional Limitation: Mobility: Walking and moving around    Melford Aase 04/04/2014, 11:19 AM Elwyn Reach, [place]

## 2014-04-04 NOTE — Progress Notes (Signed)
UR completed 

## 2014-04-04 NOTE — Plan of Care (Signed)
Problem: Phase I Progression Outcomes Goal: Pain controlled with appropriate interventions Outcome: Completed/Met Date Met:  04/04/14 Goal: Initial discharge plan identified Outcome: Completed/Met Date Met:  04/04/14 Goal: Voiding-avoid urinary catheter unless indicated Outcome: Completed/Met Date Met:  04/04/14 Goal: Hemodynamically stable Outcome: Completed/Met Date Met:  04/04/14  Problem: Phase II Progression Outcomes Goal: Discharge plan established Outcome: Completed/Met Date Met:  04/04/14 Goal: Vital signs remain stable Outcome: Completed/Met Date Met:  04/04/14  Problem: Phase III Progression Outcomes Goal: Pain controlled on oral analgesia Outcome: Completed/Met Date Met:  04/04/14 Goal: Voiding independently Outcome: Completed/Met Date Met:  04/04/14 Goal: Foley discontinued Outcome: Completed/Met Date Met:  04/04/14

## 2014-04-05 ENCOUNTER — Encounter: Payer: Self-pay | Admitting: Internal Medicine

## 2014-04-05 ENCOUNTER — Ambulatory Visit (INDEPENDENT_AMBULATORY_CARE_PROVIDER_SITE_OTHER): Payer: 59 | Admitting: Internal Medicine

## 2014-04-05 VITALS — BP 104/66 | HR 66 | Temp 98.2°F | Wt 178.2 lb

## 2014-04-05 DIAGNOSIS — R002 Palpitations: Secondary | ICD-10-CM

## 2014-04-05 DIAGNOSIS — F4310 Post-traumatic stress disorder, unspecified: Secondary | ICD-10-CM

## 2014-04-05 DIAGNOSIS — I951 Orthostatic hypotension: Secondary | ICD-10-CM

## 2014-04-05 MED ORDER — PROPRANOLOL HCL 10 MG PO TABS: ORAL_TABLET | ORAL | Status: DC

## 2014-04-05 NOTE — Patient Instructions (Signed)
Perform isometric exercise of calves  ( while seated go up on toes to count of 5 & then onto heels for 5 count). Repeat  4- 5 times prior to standing if you've been seated or supine for any significant period of time as BP drops with such positions.  To prevent palpitations or premature beats, avoid stimulants such as decongestants, diet pills, nicotine, or caffeine (coffee, tea, cola, or chocolate) to excess.

## 2014-04-05 NOTE — Progress Notes (Signed)
   Subjective:    Patient ID: Rachael Fleming, female    DOB: [birth year], 53 y.o.   MRN: [id number]  HPI She is here for hospital follow-up for admission 11/15-11/16/15 for syncope attributed to orthostatic processes.  She had had a syncopal episode after taking a hot shower approximately 2:30 in the afternoon. She had neglected to inform me of that syncopal event when I saw her.  She was seen by an ENT specialist 11/13. His evaluations was negative for any significant inner ear etiology of vertigo. She was diagnosed with peripheral vertigo hospital  She had another episode of syncope 04/02/14 standing up and praying with her eyes closed in  [place]  She was seen by OT/PT in the hospital; outpatient evaluation and therapy is scheduled  In the hospital MRI revealed a partially empty sella possibly & possible chronic microvascular ischemia  She denies any benign positional vertigo component to her symptoms.  She was prescribed long-acting propranolol in 2008 for palpitations. She rarely has these at this time. There was question this might be contributing to her symptoms.     Review of Systems  Significantly she has a history of post traumatic stress disorder and is seen by Dr.Cena who has prescribed gabapentin 400 mg 3 pills at bedtime; trazodone 50 mg 2 at bedtime and Viibryd 40 mg daily        Objective:   Physical Exam  Positive or pertinent findings include: She has a small osteoma of the hard palate. Extraocular motions intact without nystagmus. DTRs are 0-1/2 plus at the knees Pedal pulses are decreased She ambulates with a cane.  General appearance :adequately nourished; in no distress. Eyes: No conjunctival inflammation or scleral icterus is present. Oral exam: Dental hygiene is good. Lips and gums are healthy appearing.There is no oropharyngeal erythema or exudate noted.  Heart:  Normal rate and regular rhythm. S1 and S2 normal without gallop, murmur, click, rub or  other extra sounds   Lungs:Chest clear to auscultation; no wheezes, rhonchi,rales ,or rubs present.No increased work of breathing.  Abdomen: bowel sounds normal, soft and non-tender without masses, organomegaly or hernias noted.  No guarding or rebound.  Vascular : all pulses equal ; no bruits present. Skin:Warm & dry.  Intact without suspicious lesions or rashes ; no  tenting Lymphatic: No lymphadenopathy is noted about the head, neck, axilla             Assessment & Plan:  #1 orthostatic hypotension possibly aggravated by the long-acting nonselective beta blocker  #2 history of palpitations, relatively quiescent at this time  #3 post traumatic stress disorder  Plan: The long-acting propranolol will be discontinued. She'll be prescribed 10 mg every 6-8 hours as needed only. Isometric exercises were discussed prior to standing if supine or seated for a period of time.  If her symptoms fail to resolve neurology referral will be made.

## 2014-04-05 NOTE — Progress Notes (Signed)
Pre visit review using our clinic review tool, if applicable. No additional management support is needed unless otherwise documented below in the visit note. 

## 2014-04-25 ENCOUNTER — Other Ambulatory Visit: Payer: Self-pay | Admitting: Certified Nurse Midwife

## 2014-04-25 DIAGNOSIS — N6489 Other specified disorders of breast: Secondary | ICD-10-CM

## 2014-04-28 ENCOUNTER — Encounter: Payer: 59 | Admitting: Internal Medicine

## 2014-04-29 ENCOUNTER — Encounter: Payer: 59 | Admitting: Internal Medicine

## 2014-05-01 IMAGING — CT CT HEAD W/O CM
1 of 2 series · 13 of 30 positions shown, 17 images · non-contrast
Comparison: Brain MRI 02/06/2009

CLINICAL DATA: Headache

CT HEAD WITHOUT CONTRAST
TECHNIQUE: Contiguous axial images were obtained from the base of
the skull through the vertex without contrast.

[Series 2: brain · axial · 0.47mm/px · z∈[+97,+229]mm · 13 of 32 slices shown, 17 images]
[im 3/32  brain]
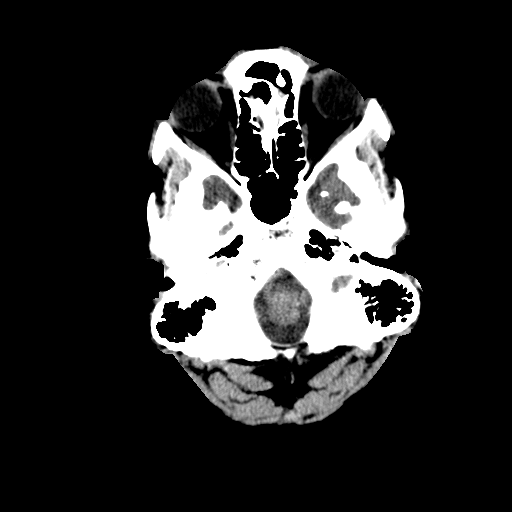
[im 3/32  bone]
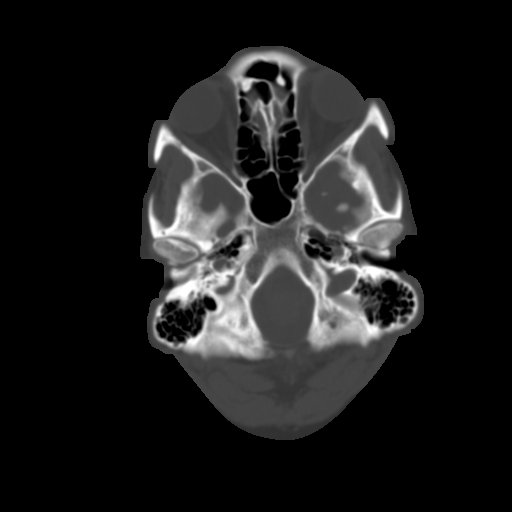
[im 5/32  brain]
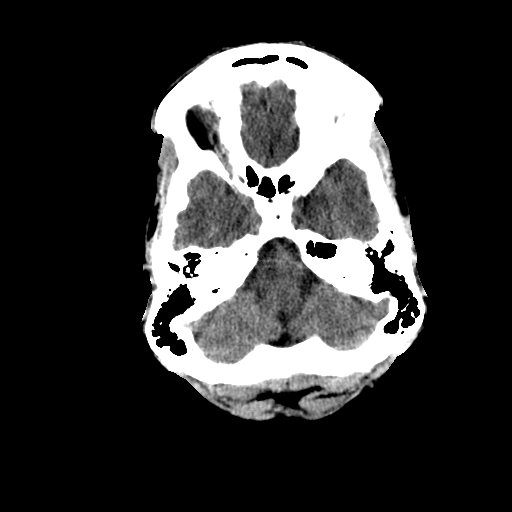
[im 7/32  brain]
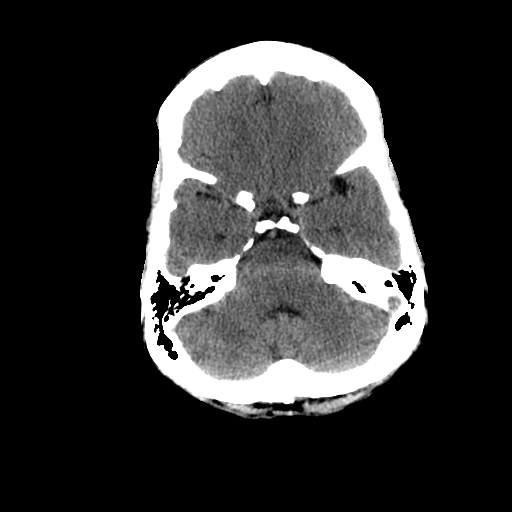
[im 9/32  brain]
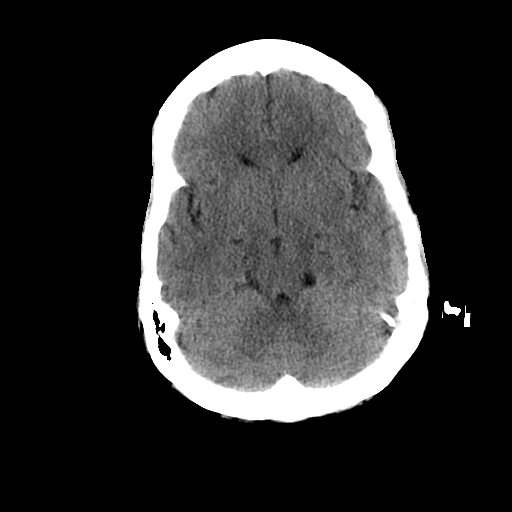
[im 12/32  brain]
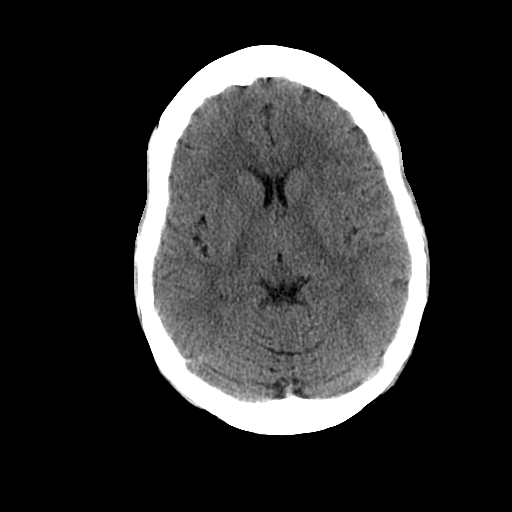
[im 12/32  bone]
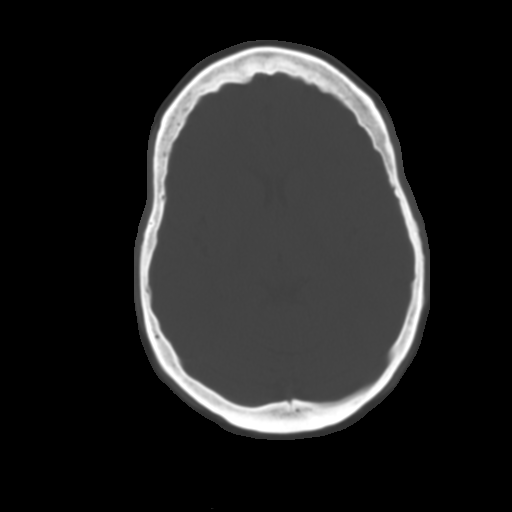
[im 14/32  brain]
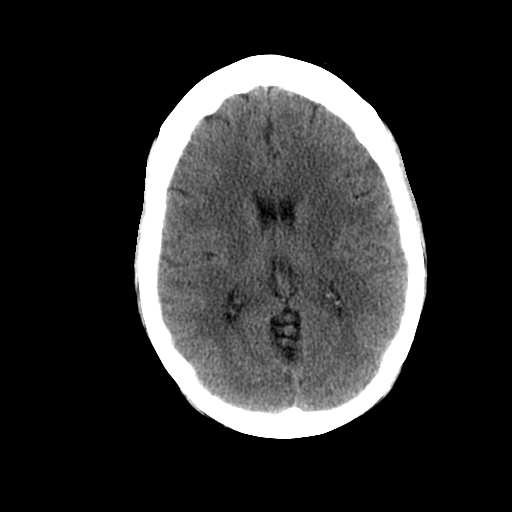
[im 16/32  brain]
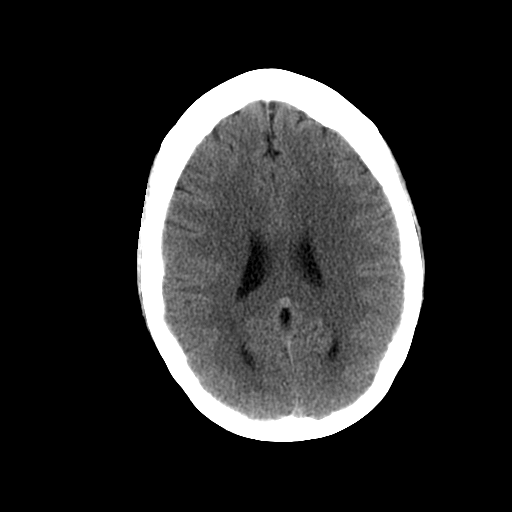
[im 18/32  brain]
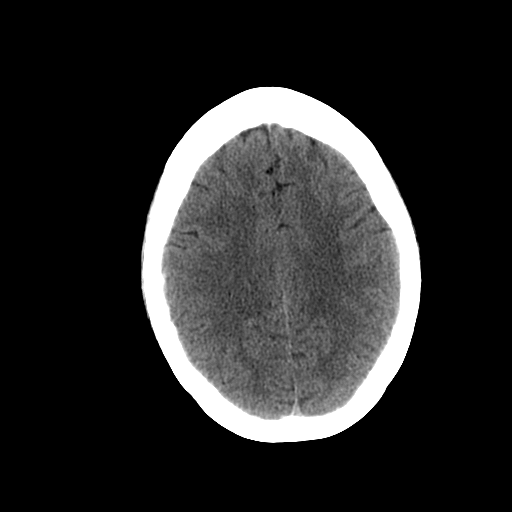
[im 20/32  brain]
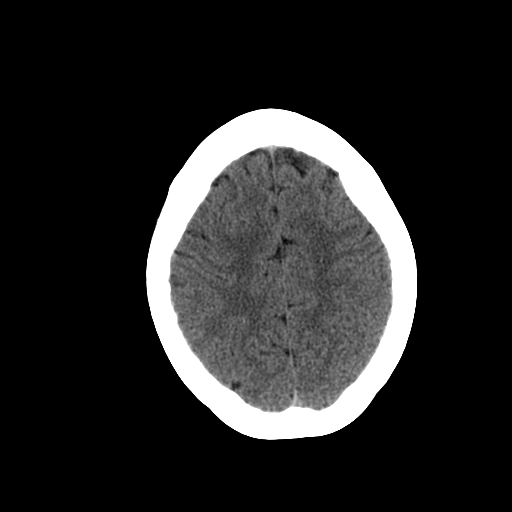
[im 20/32  bone]
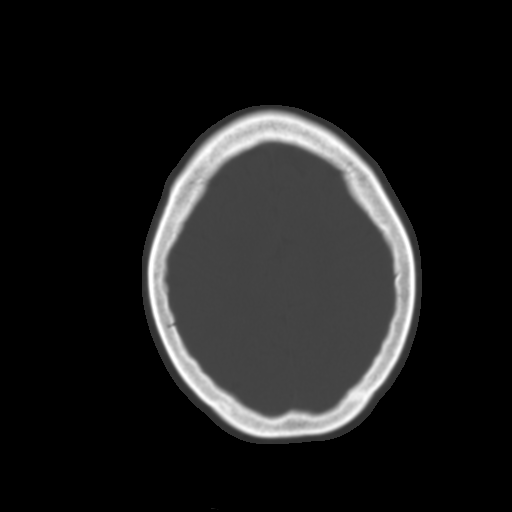
[im 23/32  brain]
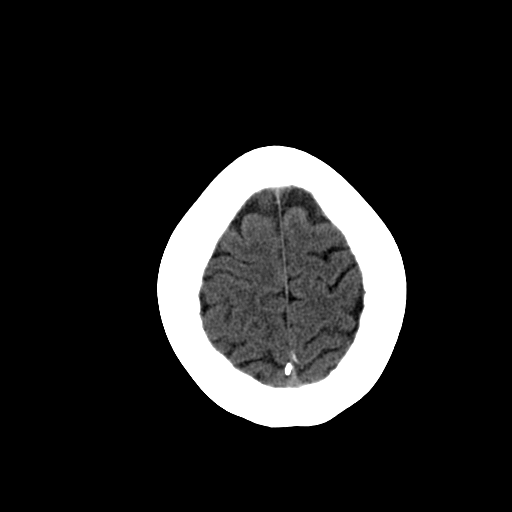
[im 25/32  brain]
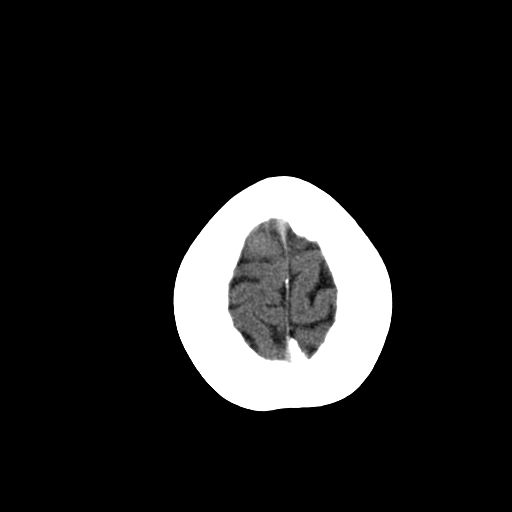
[im 27/32  brain]
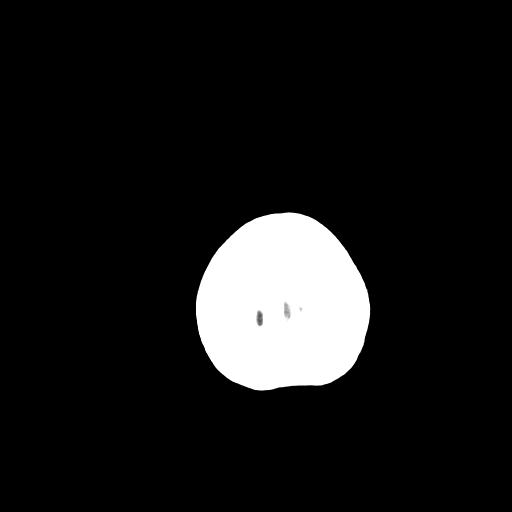
[im 29/32  brain]
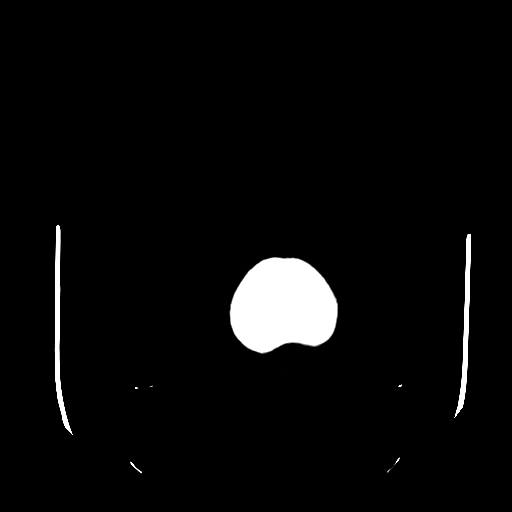
[im 29/32  bone]
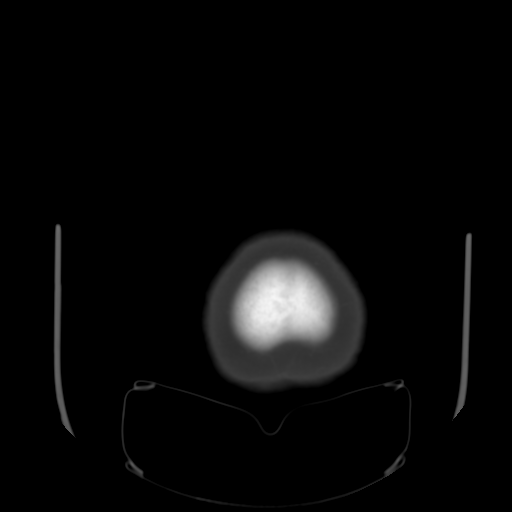

[13 of 30 positions shown; findings below may reference images not displayed]

FINDINGS: No acute hemorrhage, acute infarction, or mass lesion is
identified.  No midline shift.  No ventriculomegaly.  No skull
fracture.  Orbits and paranasal sinuses are intact.
IMPRESSION: Normal head CT.

## 2014-05-05 ENCOUNTER — Ambulatory Visit
Admission: RE | Admit: 2014-05-05 | Discharge: 2014-05-05 | Disposition: A | Discharge status: Home / Self Care | Payer: 59 | Source: Ambulatory Visit | Attending: Certified Nurse Midwife | Admitting: Certified Nurse Midwife

## 2014-05-05 DIAGNOSIS — N6489 Other specified disorders of breast: Secondary | ICD-10-CM

## 2014-06-08 ENCOUNTER — Encounter: Payer: Self-pay | Admitting: Internal Medicine

## 2014-06-08 ENCOUNTER — Ambulatory Visit (INDEPENDENT_AMBULATORY_CARE_PROVIDER_SITE_OTHER): Payer: 59 | Admitting: Internal Medicine

## 2014-06-08 VITALS — BP 122/90 | HR 66 | Temp 98.6°F | Resp 16 | Ht 61.5 in | Wt 165.1 lb

## 2014-06-08 DIAGNOSIS — I10 Essential (primary) hypertension: Secondary | ICD-10-CM

## 2014-06-08 DIAGNOSIS — F329 Major depressive disorder, single episode, unspecified: Secondary | ICD-10-CM

## 2014-06-08 DIAGNOSIS — R002 Palpitations: Secondary | ICD-10-CM

## 2014-06-08 DIAGNOSIS — F32A Depression, unspecified: Secondary | ICD-10-CM

## 2014-06-08 MED ORDER — GABAPENTIN 400 MG PO CAPS
400.0000 mg | ORAL_CAPSULE | Freq: Three times a day (TID) | ORAL | Status: DC
Start: 2014-06-08 — End: 2014-11-24

## 2014-06-08 MED ORDER — TRAZODONE HCL 100 MG PO TABS: 100.0000 mg | ORAL_TABLET | Freq: Every day | ORAL | Status: DC

## 2014-06-08 MED ORDER — VILAZODONE HCL 40 MG PO TABS: 40.0000 mg | ORAL_TABLET | Freq: Every day | ORAL | Status: DC

## 2014-06-08 NOTE — Progress Notes (Signed)
Pre visit review using our clinic review tool, if applicable. No additional management support is needed unless otherwise documented below in the visit note. 

## 2014-06-08 NOTE — Patient Instructions (Signed)
We have sent in the refills of your gabapentin, viibryd, and trazodone.   You are doing well today and doing great with being active, keep up the good work.   Come back in about 6-12 months for a check up and we will check your cholesterol then.   Health Maintenance Adopting a healthy lifestyle and getting preventive care can go a long way to promote health and wellness. Talk with your health care provider about what schedule of regular examinations is right for you. This is a good chance for you to check in with your provider about disease prevention and staying healthy. In between checkups, there are plenty of things you can do on your own. Experts have done a lot of research about which lifestyle changes and preventive measures are most likely to keep you healthy. Ask your health care provider for more information. WEIGHT AND DIET  Eat a healthy diet  Be sure to include plenty of vegetables, fruits, low-fat dairy products, and lean protein.  Do not eat a lot of foods high in solid fats, added sugars, or salt.  Get regular exercise. This is one of the most important things you can do for your health.  Most adults should exercise for at least 150 minutes each week. The exercise should increase your heart rate and make you sweat (moderate-intensity exercise).  Most adults should also do strengthening exercises at least twice a week. This is in addition to the moderate-intensity exercise.  Maintain a healthy weight  Body mass index (BMI) is a measurement that can be used to identify possible weight problems. It estimates body fat based on height and weight. Your health care provider can help determine your BMI and help you achieve or maintain a healthy weight.  For females 6 years of age and older:   A BMI below 18.5 is considered underweight.  A BMI of 18.5 to 24.9 is normal.  A BMI of 25 to 29.9 is considered overweight.  A BMI of 30 and above is considered obese.  Watch levels  of cholesterol and blood lipids  You should start having your blood tested for lipids and cholesterol at 55 years of age, then have this test every 5 years.  You may need to have your cholesterol levels checked more often if:  Your lipid or cholesterol levels are high.  You are older than 55 years of age.  You are at high risk for heart disease.  CANCER SCREENING   Lung Cancer  Lung cancer screening is recommended for adults 25-79 years old who are at high risk for lung cancer because of a history of smoking.  A yearly low-dose CT scan of the lungs is recommended for people who:  Currently smoke.  Have quit within the past 15 years.  Have at least a 30-pack-year history of smoking. A pack year is smoking an average of one pack of cigarettes a day for 1 year.  Yearly screening should continue until it has been 15 years since you quit.  Yearly screening should stop if you develop a health problem that would prevent you from having lung cancer treatment.  Breast Cancer  Practice breast self-awareness. This means understanding how your breasts normally appear and feel.  It also means doing regular breast self-exams. Let your health care provider know about any changes, no matter how small.  If you are in your 20s or 30s, you should have a clinical breast exam (CBE) by a health care provider every 1-3 years  as part of a regular health exam.  If you are 55 or older, have a CBE every year. Also consider having a breast X-ray (mammogram) every year.  If you have a family history of breast cancer, talk to your health care provider about genetic screening.  If you are at high risk for breast cancer, talk to your health care provider about having an MRI and a mammogram every year.  Breast cancer gene (BRCA) assessment is recommended for women who have family members with BRCA-related cancers. BRCA-related cancers include:  Breast.  Ovarian.  Tubal.  Peritoneal  cancers.  Results of the assessment will determine the need for genetic counseling and BRCA1 and BRCA2 testing. Cervical Cancer Routine pelvic examinations to screen for cervical cancer are no longer recommended for nonpregnant women who are considered low risk for cancer of the pelvic organs (ovaries, uterus, and vagina) and who do not have symptoms. A pelvic examination may be necessary if you have symptoms including those associated with pelvic infections. Ask your health care provider if a screening pelvic exam is right for you.   The Pap test is the screening test for cervical cancer for women who are considered at risk.  If you had a hysterectomy for a problem that was not cancer or a condition that could lead to cancer, then you no longer need Pap tests.  If you are older than 55 years, and you have had normal Pap tests for the past 10 years, you no longer need to have Pap tests.  If you have had past treatment for cervical cancer or a condition that could lead to cancer, you need Pap tests and screening for cancer for at least 20 years after your treatment.  If you no longer get a Pap test, assess your risk factors if they change (such as having a new sexual partner). This can affect whether you should start being screened again.  Some women have medical problems that increase their chance of getting cervical cancer. If this is the case for you, your health care provider may recommend more frequent screening and Pap tests.  The human papillomavirus (HPV) test is another test that may be used for cervical cancer screening. The HPV test looks for the virus that can cause cell changes in the cervix. The cells collected during the Pap test can be tested for HPV.  The HPV test can be used to screen women 55 years of age and older. Getting tested for HPV can extend the interval between normal Pap tests from three to five years.  An HPV test also should be used to screen women of any age who  have unclear Pap test results.  After 55 years of age, women should have HPV testing as often as Pap tests.  Colorectal Cancer  This type of cancer can be detected and often prevented.  Routine colorectal cancer screening usually begins at 55 years of age and continues through 55 years of age.  Your health care provider may recommend screening at an earlier age if you have risk factors for colon cancer.  Your health care provider may also recommend using home test kits to check for hidden blood in the stool.  A small camera at the end of a tube can be used to examine your colon directly (sigmoidoscopy or colonoscopy). This is done to check for the earliest forms of colorectal cancer.  Routine screening usually begins at age 57.  Direct examination of the colon should be repeated every  5-10 years through 55 years of age. However, you may need to be screened more often if early forms of precancerous polyps or small growths are found. Skin Cancer  Check your skin from head to toe regularly.  Tell your health care provider about any new moles or changes in moles, especially if there is a change in a mole's shape or color.  Also tell your health care provider if you have a mole that is larger than the size of a pencil eraser.  Always use sunscreen. Apply sunscreen liberally and repeatedly throughout the day.  Protect yourself by wearing long sleeves, pants, a wide-brimmed hat, and sunglasses whenever you are outside. HEART DISEASE, DIABETES, AND HIGH BLOOD PRESSURE   Have your blood pressure checked at least every 1-2 years. High blood pressure causes heart disease and increases the risk of stroke.  If you are between 86 years and 74 years old, ask your health care provider if you should take aspirin to prevent strokes.  Have regular diabetes screenings. This involves taking a blood sample to check your fasting blood sugar level.  If you are at a normal weight and have a low risk for  diabetes, have this test once every three years after 55 years of age.  If you are overweight and have a high risk for diabetes, consider being tested at a younger age or more often. PREVENTING INFECTION  Hepatitis B  If you have a higher risk for hepatitis B, you should be screened for this virus. You are considered at high risk for hepatitis B if:  You were born in a country where hepatitis B is common. Ask your health care provider which countries are considered high risk.  Your parents were born in a high-risk country, and you have not been immunized against hepatitis B (hepatitis B vaccine).  You have HIV or AIDS.  You use needles to inject street drugs.  You live with someone who has hepatitis B.  You have had sex with someone who has hepatitis B.  You get hemodialysis treatment.  You take certain medicines for conditions, including cancer, organ transplantation, and autoimmune conditions. Hepatitis C  Blood testing is recommended for:  Everyone born from [birth year] through 1966.  Anyone with known risk factors for hepatitis C. Sexually transmitted infections (STIs)  You should be screened for sexually transmitted infections (STIs) including gonorrhea and chlamydia if:  You are sexually active and are younger than 55 years of age.  You are older than 55 years of age and your health care provider tells you that you are at risk for this type of infection.  Your sexual activity has changed since you were last screened and you are at an increased risk for chlamydia or gonorrhea. Ask your health care provider if you are at risk.  If you do not have HIV, but are at risk, it may be recommended that you take a prescription medicine daily to prevent HIV infection. This is called pre-exposure prophylaxis (PrEP). You are considered at risk if:  You are sexually active and do not regularly use condoms or know the HIV status of your partner(s).  You take drugs by injection.  You are  sexually active with a partner who has HIV. Talk with your health care provider about whether you are at high risk of being infected with HIV. If you choose to begin PrEP, you should first be tested for HIV. You should then be tested every 3 months for as long as you  are taking PrEP.  PREGNANCY   If you are premenopausal and you may become pregnant, ask your health care provider about preconception counseling.  If you may become pregnant, take 400 to 800 micrograms (mcg) of folic acid every day.  If you want to prevent pregnancy, talk to your health care provider about birth control (contraception). OSTEOPOROSIS AND MENOPAUSE   Osteoporosis is a disease in which the bones lose minerals and strength with aging. This can result in serious bone fractures. Your risk for osteoporosis can be identified using a bone density scan.  If you are 66 years of age or older, or if you are at risk for osteoporosis and fractures, ask your health care provider if you should be screened.  Ask your health care provider whether you should take a calcium or vitamin D supplement to lower your risk for osteoporosis.  Menopause may have certain physical symptoms and risks.  Hormone replacement therapy may reduce some of these symptoms and risks. Talk to your health care provider about whether hormone replacement therapy is right for you.  HOME CARE INSTRUCTIONS   Schedule regular health, dental, and eye exams.  Stay current with your immunizations.   Do not use any tobacco products including cigarettes, chewing tobacco, or electronic cigarettes.  If you are pregnant, do not drink alcohol.  If you are breastfeeding, limit how much and how often you drink alcohol.  Limit alcohol intake to no more than 1 drink per day for nonpregnant women. One drink equals 12 ounces of beer, 5 ounces of wine, or 1 ounces of hard liquor.  Do not use street drugs.  Do not share needles.  Ask your health care provider for  help if you need support or information about quitting drugs.  Tell your health care provider if you often feel depressed.  Tell your health care provider if you have ever been abused or do not feel safe at home. Document Released: 11/19/2010 Document Revised: 09/20/2013 Document Reviewed: 04/07/2013 [facility] Patient Information 2015 [place], [place]. This information is not intended to replace advice given to you by your health care provider. Make sure you discuss any questions you have with your health care provider.

## 2014-06-11 NOTE — Assessment & Plan Note (Signed)
No recurrence since off propanolol and will continue to monitor at this time.

## 2014-06-11 NOTE — Progress Notes (Signed)
   Subjective:    Patient ID: Rachael Fleming, female    DOB: [birth year], 54 y.o.   MRN: [id number]  HPI The patient is a 54 YO female who is coming in to follow up on a medication change. She was taken off propanolol long acting several months ago and this has made her much better. She is not having any palpitations. Her lightheadedness is much improved. She also is now under the care of a psychologist only and needs someone to continue her mental health medications. She is doing well and denies any low moods or anxiety. She knows that she should be exercising more but has not been able to make that happen.   Review of Systems  Constitutional: Negative for fever, activity change, appetite change, fatigue and unexpected weight change.  HENT: Negative.   Respiratory: Negative for cough, chest tightness, shortness of breath and wheezing.   Cardiovascular: Negative for chest pain, palpitations and leg swelling.  Gastrointestinal: Negative.   Musculoskeletal: Negative.   Skin: Negative.   Neurological: Negative for dizziness, weakness, light-headedness and headaches.  Psychiatric/Behavioral: Negative.       Objective:   Physical Exam  Constitutional: She is oriented to person, place, and time. She appears well-developed and well-nourished.  Overweight  HENT:  Head: Normocephalic and atraumatic.  Eyes: EOM are normal.  Neck: Normal range of motion.  Cardiovascular: Normal rate and regular rhythm.   No murmur heard. Pulmonary/Chest: Effort normal and breath sounds normal. No respiratory distress. She has no wheezes. She has no rales.  Abdominal: Soft. She exhibits no distension. There is no tenderness.  Musculoskeletal: She exhibits no edema.  Neurological: She is alert and oriented to person, place, and time. Coordination normal.  Skin: Skin is warm and dry.  Psychiatric: She has a normal mood and affect. Her behavior is normal.          Assessment & Plan:

## 2014-06-11 NOTE — Assessment & Plan Note (Signed)
Depression with insomnia controlled well right now with viibryd and trazodone. She will continue with therapy and if adjustments in regimen are needed will refer back to psychiatry

## 2014-06-11 NOTE — Assessment & Plan Note (Signed)
BP normal today off medications. Will continue to watch closely for any abnormal blood pressure.

## 2014-08-01 ENCOUNTER — Ambulatory Visit (INDEPENDENT_AMBULATORY_CARE_PROVIDER_SITE_OTHER): Payer: 59 | Admitting: Nurse Practitioner

## 2014-08-01 ENCOUNTER — Encounter: Payer: Self-pay | Admitting: *Deleted

## 2014-08-01 ENCOUNTER — Encounter: Payer: Self-pay | Admitting: Nurse Practitioner

## 2014-08-01 VITALS — BP 130/92 | HR 67 | Temp 98.8°F | Ht 61.5 in | Wt 160.0 lb

## 2014-08-01 DIAGNOSIS — J069 Acute upper respiratory infection, unspecified: Secondary | ICD-10-CM

## 2014-08-01 DIAGNOSIS — B9789 Other viral agents as the cause of diseases classified elsewhere: Principal | ICD-10-CM

## 2014-08-01 MED ORDER — HYDROCODONE-HOMATROPINE 5-1.5 MG/5ML PO SYRP
5.0000 mL | ORAL_SOLUTION | Freq: Every evening | ORAL | Status: DC | PRN
Start: 2014-08-01 — End: 2014-08-06

## 2014-08-01 MED ORDER — BENZONATATE 200 MG PO CAPS: 200.0000 mg | ORAL_CAPSULE | Freq: Three times a day (TID) | ORAL | Status: DC | PRN

## 2014-08-01 NOTE — Progress Notes (Signed)
Pre visit review using our clinic review tool, if applicable. No additional management support is needed unless otherwise documented below in the visit note. 

## 2014-08-01 NOTE — Patient Instructions (Signed)
You have a virus causing your symptoms. The average duration of symptoms is 3 to 4 weeks.   For nasal congestion: Start daily sinus rinses (neilmed Sinus Rinse). Vicks vapor rub under nose to help breathe. For cough: Take benzonatate capsules during day for cough. Use cough syrup at night. Tylenol or ibuprophen for headache. Sip fluids every hour. Rest. If you are not feeling better in 10 days or develop fever or chest pain, call us for re-evaluation. Feel better!  Upper Respiratory Infection, Adult An upper respiratory infection (URI) is also sometimes known as the common cold. The upper respiratory tract includes the nose, sinuses, throat, trachea, and bronchi. Bronchi are the airways leading to the lungs. Most people improve within 1 week, but symptoms can last up to 2 weeks. A residual cough may last even longer.  CAUSES Many different viruses can infect the tissues lining the upper respiratory tract. The tissues become irritated and inflamed and often become very moist. Mucus production is also common. A cold is contagious. You can easily spread the virus to others by oral contact. This includes kissing, sharing a glass, coughing, or sneezing. Touching your mouth or nose and then touching a surface, which is then touched by another person, can also spread the virus. SYMPTOMS  Symptoms typically develop 1 to 3 days after you come in contact with a cold virus. Symptoms vary from person to person. They may include:  Runny nose.  Sneezing.  Nasal congestion.  Sinus irritation.  Sore throat.  Loss of voice (laryngitis).  Cough.  Fatigue.  Muscle aches.  Loss of appetite.  Headache.  Low-grade fever. DIAGNOSIS  You might diagnose your own cold based on familiar symptoms, since most people get a cold 2 to 3 times a year. Your caregiver can confirm this based on your exam. Most importantly, your caregiver can check that your symptoms are not due to another disease such as strep  throat, sinusitis, pneumonia, asthma, or epiglottitis. Blood tests, throat tests, and X-rays are not necessary to diagnose a common cold, but they may sometimes be helpful in excluding other more serious diseases. Your caregiver will decide if any further tests are required. RISKS AND COMPLICATIONS  You may be at risk for a more severe case of the common cold if you smoke cigarettes, have chronic heart disease (such as heart failure) or lung disease (such as asthma), or if you have a weakened immune system. The very young and very old are also at risk for more serious infections. Bacterial sinusitis, middle ear infections, and bacterial pneumonia can complicate the common cold. The common cold can worsen asthma and chronic obstructive pulmonary disease (COPD). Sometimes, these complications can require emergency medical care and may be life-threatening. PREVENTION  The best way to protect against getting a cold is to practice good hygiene. Avoid oral or hand contact with people with cold symptoms. Wash your hands often if contact occurs. There is no clear evidence that vitamin C, vitamin E, echinacea, or exercise reduces the chance of developing a cold. However, it is always recommended to get plenty of rest and practice good nutrition. TREATMENT  Treatment is directed at relieving symptoms. There is no cure. Antibiotics are not effective, because the infection is caused by a virus, not by bacteria. Treatment may include:  Increased fluid intake. Sports drinks offer valuable electrolytes, sugars, and fluids.  Breathing heated mist or steam (vaporizer or shower).  Eating chicken soup or other clear broths, and maintaining good nutrition.  Getting  plenty of rest.  Using gargles or lozenges for comfort.  Controlling fevers with ibuprofen or acetaminophen as directed by your caregiver.  Increasing usage of your inhaler if you have asthma. Zinc gel and zinc lozenges, taken in the first 24 hours of  the common cold, can shorten the duration and lessen the severity of symptoms. Pain medicines may help with fever, muscle aches, and throat pain. A variety of non-prescription medicines are available to treat congestion and runny nose. Your caregiver can make recommendations and may suggest nasal or lung inhalers for other symptoms.  HOME CARE INSTRUCTIONS   Only take over-the-counter or prescription medicines for pain, discomfort, or fever as directed by your caregiver.  Use a warm mist humidifier or inhale steam from a shower to increase air moisture. This may keep secretions moist and make it easier to breathe.  Drink enough water and fluids to keep your urine clear or pale yellow.  Rest as needed.  Return to work when your temperature has returned to normal or as your caregiver advises. You may need to stay home longer to avoid infecting others. You can also use a face mask and careful hand washing to prevent spread of the virus. SEEK MEDICAL CARE IF:   After the first few days, you feel you are getting worse rather than better.  You need your caregiver's advice about medicines to control symptoms.  You develop chills, worsening shortness of breath, or brown or red sputum. These may be signs of pneumonia.  You develop yellow or brown nasal discharge or pain in the face, especially when you bend forward. These may be signs of sinusitis.  You develop a fever, swollen neck glands, pain with swallowing, or white areas in the back of your throat. These may be signs of strep throat. SEEK IMMEDIATE MEDICAL CARE IF:   You have a fever.  You develop severe or persistent headache, ear pain, sinus pain, or chest pain.  You develop wheezing, a prolonged cough, cough up blood, or have a change in your usual mucus (if you have chronic lung disease).  You develop sore muscles or a stiff neck. Document Released: 10/30/2000 Document Revised: 07/29/2011 Document Reviewed: 09/07/2010 [facility]  Patient Information 2014 [place], [place].

## 2014-08-01 NOTE — Progress Notes (Signed)
   Subjective:    Patient ID: Rachael Fleming, female    DOB: 1960-08-05, 54 y.o.   MRN: [id number]  Cough This is a new problem. The current episode started yesterday. The problem has been gradually worsening. The cough is non-productive. Associated symptoms include nasal congestion. Pertinent negatives include no chest pain, chills, ear congestion, ear pain, fever, headaches, hemoptysis, myalgias, postnasal drip, sore throat, shortness of breath or wheezing. The symptoms are aggravated by lying down. She has tried OTC cough suppressant for the symptoms. The treatment provided no relief.      Review of Systems  Constitutional: Negative for fever, chills and fatigue.  HENT: Negative for ear pain, postnasal drip and sore throat.   Respiratory: Negative for hemoptysis, shortness of breath and wheezing.   Cardiovascular: Negative for chest pain.  Musculoskeletal: Negative for myalgias.  Neurological: Negative for headaches.       Objective:   Physical Exam  Constitutional: She is oriented to person, place, and time. She appears well-developed and well-nourished. No distress.  HENT:  Head: Normocephalic and atraumatic.  Right Ear: External ear normal.  Left Ear: External ear normal.  Mouth/Throat: Oropharynx is clear and moist. No oropharyngeal exudate.  Eyes: Conjunctivae are normal. Right eye exhibits no discharge. Left eye exhibits no discharge.  Neck: Normal range of motion. No thyromegaly present.  Cardiovascular: Normal rate, regular rhythm and normal heart sounds.   No murmur heard. Pulmonary/Chest: Effort normal and breath sounds normal.  freq cough during exam  Lymphadenopathy:    She has no cervical adenopathy.  Neurological: She is alert and oriented to person, place, and time.  Skin: Skin is warm and dry.  Psychiatric: She has a normal mood and affect. Her behavior is normal. Thought content normal.  Vitals reviewed.         Assessment & Plan:  1. Viral upper  respiratory tract infection with cough Symptom management - benzonatate (TESSALON) 200 MG capsule; Take 1 capsule (200 mg total) by mouth 3 (three) times daily as needed for cough.  Dispense: 60 capsule; Refill: 0 - HYDROcodone-homatropine (HYCODAN) 5-1.5 MG/5ML syrup; Take 5 mLs by mouth at bedtime as needed for cough.  Dispense: 120 mL; Refill: 0 F/u PRN

## 2014-08-06 ENCOUNTER — Ambulatory Visit (INDEPENDENT_AMBULATORY_CARE_PROVIDER_SITE_OTHER): Payer: 59 | Admitting: Family Medicine

## 2014-08-06 VITALS — BP 148/60 | HR 66 | Temp 98.2°F | Resp 16 | Ht 61.5 in | Wt 161.4 lb

## 2014-08-06 DIAGNOSIS — J209 Acute bronchitis, unspecified: Secondary | ICD-10-CM

## 2014-08-06 DIAGNOSIS — B9789 Other viral agents as the cause of diseases classified elsewhere: Secondary | ICD-10-CM

## 2014-08-06 DIAGNOSIS — H6503 Acute serous otitis media, bilateral: Secondary | ICD-10-CM | POA: Diagnosis not present

## 2014-08-06 DIAGNOSIS — J069 Acute upper respiratory infection, unspecified: Secondary | ICD-10-CM | POA: Diagnosis not present

## 2014-08-06 MED ORDER — ALBUTEROL SULFATE HFA 108 (90 BASE) MCG/ACT IN AERS: 2.0000 | INHALATION_SPRAY | RESPIRATORY_TRACT | Status: DC | PRN

## 2014-08-06 MED ORDER — AMOXICILLIN-POT CLAVULANATE 875-125 MG PO TABS: 1.0000 | ORAL_TABLET | Freq: Two times a day (BID) | ORAL | Status: DC

## 2014-08-06 MED ORDER — BENZONATATE 200 MG PO CAPS: 200.0000 mg | ORAL_CAPSULE | Freq: Three times a day (TID) | ORAL | Status: DC | PRN

## 2014-08-06 MED ORDER — ALBUTEROL SULFATE (2.5 MG/3ML) 0.083% IN NEBU: 2.5000 mg | INHALATION_SOLUTION | Freq: Once | RESPIRATORY_TRACT | Status: DC

## 2014-08-06 MED ORDER — MUCINEX DM MAXIMUM STRENGTH 60-1200 MG PO TB12: 1.0000 | ORAL_TABLET | Freq: Two times a day (BID) | ORAL | Status: DC

## 2014-08-06 MED ORDER — IPRATROPIUM BROMIDE 0.02 % IN SOLN: 0.5000 mg | Freq: Once | RESPIRATORY_TRACT | Status: DC

## 2014-08-06 MED ORDER — PSEUDOEPHEDRINE HCL ER 120 MG PO TB12: 120.0000 mg | ORAL_TABLET | Freq: Two times a day (BID) | ORAL | Status: DC

## 2014-08-06 MED ORDER — PROMETHAZINE HCL 25 MG PO TABS: 25.0000 mg | ORAL_TABLET | Freq: Three times a day (TID) | ORAL | Status: DC | PRN

## 2014-08-06 NOTE — Progress Notes (Signed)
Subjective:    Patient ID: Rachael Fleming, female    DOB: [birth year], 54 y.o.   MRN: [id number] This chart was scribed for Delman Cheadle, MD by Marti Sleigh, Medical Scribe. This patient was seen in Room 9 and the patient's care was started a 3:31 PM.  Chief Complaint  Patient presents with  . Cough    x 2 weeks   . Otalgia    both today     HPI  Past Medical History  Diagnosis Date  . DEPRESSION     PTSD complication, working at [employer]  . GLAUCOMA   . HYPERTENSION   . GERD   . ARTHRITIS   . Migraine   . Anxiety   . Abnormal Pap smear 1984    [place]  . Heart palpitations 03/2013  . Vertigo    Allergies  Allergen Reactions  . Penicillins     REACTION: Nausea \\T \ vomitting   Current Outpatient Prescriptions on File Prior to Visit  Medication Sig Dispense Refill  . aspirin EC 81 MG tablet Take 81 mg by mouth daily.    . benzonatate (TESSALON) 200 MG capsule Take 1 capsule (200 mg total) by mouth 3 (three) times daily as needed for cough. 60 capsule 0  . gabapentin (NEURONTIN) 400 MG capsule Take 1 capsule (400 mg total) by mouth 3 (three) times daily. 90 capsule 6  . HYDROcodone-homatropine (HYCODAN) 5-1.5 MG/5ML syrup Take 5 mLs by mouth at bedtime as needed for cough. 120 mL 0  . Multiple Vitamins-Minerals (MULTIVITAMIN & MINERAL PO) Take by mouth daily. Alive 50+ MVI.    Marland Kitchen scopolamine (TRANSDERM-SCOP) 1 MG/3DAYS Place 1 patch (1.5 mg total) onto the skin every 3 (three) days. 10 patch 12  . traZODone (DESYREL) 100 MG tablet Take 1 tablet (100 mg total) by mouth at bedtime. 100-200mg  30 tablet 6  . Vilazodone HCl (VIIBRYD) 40 MG TABS Take 1 tablet (40 mg total) by mouth daily. 90 tablet 3   No current facility-administered medications on file prior to visit.    HPI Comments: Rachael Fleming is a 54 y.o. female who presents to [facility] complaining of a persistent severe cough that started two weeks ago. Pt reported to her PCP on 08/01/14 and was was prescribed  tessalon pearls and hycodan cough syrup, but only filled the hycodan prescription. Pt states she had SOB on Wednesday, but felt significantly better yesterday and reported to work, but woke up this morning feeling significantly worse again. Pt states she finished her cough syrup on Thursday. Pt does not smoke. Pt states she had to use an inhaler in 2012.   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   Notes from last HPI: Pt was seen at [facility] [address] 5 days ago. Diagnosed with viral URI, and was prescribed tessalon pearls and hycodan cough syrup. Hycodan was prescribed 40mils at bedtime, dispense 120mg .    Review of Systems  Constitutional: Positive for activity change and fatigue. Negative for fever and chills.  HENT: Positive for congestion, ear pain, postnasal drip, rhinorrhea and sore throat. Negative for nosebleeds, sinus pressure and trouble swallowing.   Respiratory: Positive for cough, shortness of breath and wheezing. Negative for chest tightness.   Cardiovascular: Negative for chest pain.  Hematological: Negative for adenopathy.  Psychiatric/Behavioral: Positive for sleep disturbance.       Objective:   Physical Exam  Constitutional: She is oriented to person, place, and time. She appears well-developed and well-nourished. No  distress.  HENT:  Head: Normocephalic and atraumatic.  Mid ear effusion on left. Retraction on right. Oropharynx with edema and erythema, but no exudates.  Eyes: Conjunctivae are normal. Pupils are equal, round, and reactive to light.  Neck: Neck supple.  Cardiovascular: Normal rate, regular rhythm and normal heart sounds.   No murmur heard. Pulmonary/Chest: Effort normal. No respiratory distress. She has wheezes.  Breath sounds reduced throughout. Decreased expiratory phase.   Abdominal: Soft.  Musculoskeletal: Normal range of motion.  Neurological: She is alert and oriented to person, place, and time. Coordination normal.  Skin: Skin is  warm and dry. She is not diaphoretic.  Psychiatric: She has a normal mood and affect. Her behavior is normal.  Nursing note and vitals reviewed.     BP 148/60 mmHg  Pulse 66  Temp(Src) 98.2 F (36.8 C) (Oral)  Resp 16  Ht 5' 1.5" (1.562 m)  Wt 161 lb 6.4 oz (73.211 kg)  BMI 30.01 kg/m2  SpO2 94%  LMP 12/18/2005  Assessment & Plan:   Acute bronchitis, unspecified organism - Plan: albuterol (PROVENTIL) (2.5 MG/3ML) 0.083% nebulizer solution 2.5 mg, ipratropium (ATROVENT) nebulizer solution 0.5 mg  Bilateral acute serous otitis media, recurrence not specified  Viral upper respiratory tract infection with cough - Plan: benzonatate (TESSALON) 200 MG capsule  Meds ordered this encounter  Medications  . albuterol (PROVENTIL) (2.5 MG/3ML) 0.083% nebulizer solution 2.5 mg    Sig:   . ipratropium (ATROVENT) nebulizer solution 0.5 mg    Sig:   . benzonatate (TESSALON) 200 MG capsule    Sig: Take 1 capsule (200 mg total) by mouth 3 (three) times daily as needed for cough.    Dispense:  60 capsule    Refill:  0  . Dextromethorphan-Guaifenesin (MUCINEX DM MAXIMUM STRENGTH) 60-1200 MG TB12    Sig: Take 1 tablet by mouth every 12 (twelve) hours.    Dispense:  30 each    Refill:  0  . pseudoephedrine (SUDAFED 12 HOUR) 120 MG 12 hr tablet    Sig: Take 1 tablet (120 mg total) by mouth 2 (two) times daily.    Dispense:  30 tablet    Refill:  0  . amoxicillin-clavulanate (AUGMENTIN) 875-125 MG per tablet    Sig: Take 1 tablet by mouth 2 (two) times daily.    Dispense:  28 tablet    Refill:  0  . promethazine (PHENERGAN) 25 MG tablet    Sig: Take 1 tablet (25 mg total) by mouth every 8 (eight) hours as needed for nausea or vomiting (for penicillin nausea).    Dispense:  20 tablet    Refill:  0  . albuterol (PROVENTIL HFA;VENTOLIN HFA) 108 (90 BASE) MCG/ACT inhaler    Sig: Inhale 2 puffs into the lungs every 4 (four) hours as needed for wheezing or shortness of breath (cough, shortness of  breath or wheezing.).    Dispense:  1 Inhaler    Refill:  0    I personally performed the services described in this documentation, which was scribed in my presence. The recorded information has been reviewed and considered, and addended by me as needed.  Delman Cheadle, MD MPH

## 2014-08-06 NOTE — Patient Instructions (Signed)
Acute Bronchitis Bronchitis is inflammation of the airways that extend from the windpipe into the lungs (bronchi). The inflammation often causes mucus to develop. This leads to a cough, which is the most common symptom of bronchitis.  In acute bronchitis, the condition usually develops suddenly and goes away over time, usually in a couple weeks. Smoking, allergies, and asthma can make bronchitis worse. Repeated episodes of bronchitis may cause further lung problems.  CAUSES Acute bronchitis is most often caused by the same virus that causes a cold. The virus can spread from person to person (contagious) through coughing, sneezing, and touching contaminated objects. SIGNS AND SYMPTOMS   Cough.   Fever.   Coughing up mucus.   Body aches.   Chest congestion.   Chills.   Shortness of breath.   Sore throat.  DIAGNOSIS  Acute bronchitis is usually diagnosed through a physical exam. Your health care provider will also ask you questions about your medical history. Tests, such as chest X-rays, are sometimes done to rule out other conditions.  TREATMENT  Acute bronchitis usually goes away in a couple weeks. Oftentimes, no medical treatment is necessary. Medicines are sometimes given for relief of fever or cough. Antibiotic medicines are usually not needed but may be prescribed in certain situations. In some cases, an inhaler may be recommended to help reduce shortness of breath and control the cough. A cool mist vaporizer may also be used to help thin bronchial secretions and make it easier to clear the chest.  HOME CARE INSTRUCTIONS  Get plenty of rest.   Drink enough fluids to keep your urine clear or pale yellow (unless you have a medical condition that requires fluid restriction). Increasing fluids may help thin your respiratory secretions (sputum) and reduce chest congestion, and it will prevent dehydration.   Take medicines only as directed by your health care provider.  If  you were prescribed an antibiotic medicine, finish it all even if you start to feel better.  Avoid smoking and secondhand smoke. Exposure to cigarette smoke or irritating chemicals will make bronchitis worse. If you are a smoker, consider using nicotine gum or skin patches to help control withdrawal symptoms. Quitting smoking will help your lungs heal faster.   Reduce the chances of another bout of acute bronchitis by washing your hands frequently, avoiding people with cold symptoms, and trying not to touch your hands to your mouth, nose, or eyes.   Keep all follow-up visits as directed by your health care provider.  SEEK MEDICAL CARE IF: Your symptoms do not improve after 1 week of treatment.  SEEK IMMEDIATE MEDICAL CARE IF:  You develop an increased fever or chills.   You have chest pain.   You have severe shortness of breath.  You have bloody sputum.   You develop dehydration.  You faint or repeatedly feel like you are going to pass out.  You develop repeated vomiting.  You develop a severe headache. MAKE SURE YOU:   Understand these instructions.  Will watch your condition.  Will get help right away if you are not doing well or get worse. Document Released: 06/13/2004 Document Revised: 09/20/2013 Document Reviewed: 10/27/2012 [facility] Patient Information 2015 [place], [place]. This information is not intended to replace advice given to you by your health care provider. Make sure you discuss any questions you have with your health care provider. Serous Otitis Media Serous otitis media is fluid in the middle ear space. This space contains the bones for hearing and air. Air in  the middle ear space helps to transmit sound.  The air gets there through the eustachian tube. This tube goes from the back of the nose (nasopharynx) to the middle ear space. It keeps the pressure in the middle ear the same as the outside world. It also helps to drain fluid from the middle ear  space. CAUSES  Serous otitis media occurs when the eustachian tube gets blocked. Blockage can come from:  Ear infections.  Colds and other upper respiratory infections.  Allergies.  Irritants such as cigarette smoke.  Sudden changes in air pressure (such as descending in an airplane).  Enlarged adenoids.  A mass in the nasopharynx. During colds and upper respiratory infections, the middle ear space can become temporarily filled with fluid. This can happen after an ear infection also. Once the infection clears, the fluid will generally drain out of the ear through the eustachian tube. If it does not, then serous otitis media occurs. SIGNS AND SYMPTOMS   Hearing loss.  A feeling of fullness in the ear, without pain.  Young children may not show any symptoms but may show slight behavioral changes, such as agitation, ear pulling, or crying. DIAGNOSIS  Serous otitis media is diagnosed by an ear exam. Tests may be done to check on the movement of the eardrum. Hearing exams may also be done. TREATMENT  The fluid most often goes away without treatment. If allergy is the cause, allergy treatment may be helpful. Fluid that persists for several months may require minor surgery. A small tube is placed in the eardrum to:  Drain the fluid.  Restore the air in the middle ear space. In certain situations, antibiotic medicines are used to avoid surgery. Surgery may be done to remove enlarged adenoids (if this is the cause). HOME CARE INSTRUCTIONS   Keep children away from tobacco smoke.  Keep all follow-up visits as directed by your health care provider. SEEK MEDICAL CARE IF:   Your hearing is not better in 3 months.  Your hearing is worse.  You have ear pain.  You have drainage from the ear.  You have dizziness.  You have serous otitis media only in one ear or have any bleeding from your nose (epistaxis).  You notice a lump on your neck. MAKE SURE YOU:  Understand these  instructions.   Will watch your condition.   Will get help right away if you are not doing well or get worse.  Document Released: 07/27/2003 Document Revised: 09/20/2013 Document Reviewed: 12/01/2012 [facility] Patient Information 2015 [place], [place]. This information is not intended to replace advice given to you by your health care provider. Make sure you discuss any questions you have with your health care provider.

## 2014-08-10 ENCOUNTER — Telehealth: Payer: Self-pay

## 2014-08-10 NOTE — Telephone Encounter (Signed)
Yes, this is ok. Her assessment and plan from 3/19 is not yet completed but I think as she was here with bronchitis which is irritated at work it is reasonable to give her one week out. Note will be waiting for her at [facility]. Thank you.

## 2014-08-10 NOTE — Telephone Encounter (Signed)
Patient would like a note excusing her from work from today 3/23 until 3/30. Patient phone [phone]

## 2014-08-10 NOTE — Telephone Encounter (Signed)
Pt was seen on the 19th, is this ok? Spoke with pt, she states she is not feeling better. She states she is taking her medication but the cough is not getting better. She works at a [employer] and feels this aggravates her cough. Please advise.

## 2014-08-11 NOTE — Telephone Encounter (Signed)
Called pt, advised note ready to pick up.

## 2014-08-29 ENCOUNTER — Ambulatory Visit (INDEPENDENT_AMBULATORY_CARE_PROVIDER_SITE_OTHER): Payer: 59 | Admitting: Internal Medicine

## 2014-08-29 ENCOUNTER — Encounter: Payer: Self-pay | Admitting: Internal Medicine

## 2014-08-29 VITALS — BP 122/88 | HR 76 | Temp 98.3°F | Resp 18 | Wt 157.0 lb

## 2014-08-29 DIAGNOSIS — G4700 Insomnia, unspecified: Secondary | ICD-10-CM | POA: Diagnosis not present

## 2014-08-29 MED ORDER — ESZOPICLONE 3 MG PO TABS: 3.0000 mg | ORAL_TABLET | Freq: Every day | ORAL | Status: DC

## 2014-08-29 MED ORDER — VILAZODONE HCL 40 MG PO TABS: 40.0000 mg | ORAL_TABLET | Freq: Every day | ORAL | Status: DC

## 2014-08-29 NOTE — Progress Notes (Signed)
Pre visit review using our clinic review tool, if applicable. No additional management support is needed unless otherwise documented below in the visit note. 

## 2014-08-29 NOTE — Progress Notes (Signed)
   Subjective:    Patient ID: Rachael Fleming, female    DOB: 06-28-1960, 54 y.o.   MRN: [id number]  HPI The patient is a 54 YO female who is coming in today for insomnia. This is a chronic problem for her but has worsened in the last several weeks. They are having a merger at work which is causing her some stress and anxiety as she may lose her job. She was taking the trazodone successfully but it is not working anymore. She has even tried doubling the dose without any relief. She is having a lot of thoughts at night time. She is avoiding caffeine and tv and phone usage at night time without relief. Is tired some during the day.   Review of Systems  Constitutional: Negative.   Respiratory: Negative.   Cardiovascular: Negative.   Neurological: Negative.   Psychiatric/Behavioral: Positive for sleep disturbance and dysphoric mood. Negative for suicidal ideas, behavioral problems, self-injury and decreased concentration.      Objective:   Physical Exam  Constitutional: She is oriented to person, place, and time. She appears well-developed and well-nourished.  HENT:  Head: Normocephalic and atraumatic.  Cardiovascular: Normal rate and regular rhythm.   Pulmonary/Chest: Effort normal and breath sounds normal.  Neurological: She is alert and oriented to person, place, and time. Coordination normal.  Skin: Skin is warm and dry.  Psychiatric: She has a normal mood and affect.   Filed Vitals:   08/29/14 0936  BP: 122/88  Pulse: 76  Temp: 98.3 F (36.8 C)  TempSrc: Oral  Resp: 18  Weight: 157 lb (71.215 kg)  SpO2: 97%      Assessment & Plan:

## 2014-08-29 NOTE — Assessment & Plan Note (Signed)
In acute exacerbation right now due to increased stress at work and worry about losing her job. She has made peace with it but the merger is chronically elevating her stress levels. She has tried increasing trazodone without relief. Will try lunesta for short time while she is stressed then work on getting her back to trazodone alone. Talked with her about sleep hygiene today as well and she will work to continue with these good practices. Stop trazodone while on the lunesta.

## 2014-08-29 NOTE — Patient Instructions (Signed)
We will have you try a medicine called lunesta which is also a sleep medicine to help you through this rough patch. Take it about 30-40 minutes before you want to fall asleep. If you have any problems with it call the office as there are other options out there.   We have also sent in the refills of the trazodone. If the lunesta is not enough on its own we may have you try taking the trazodone with the lunesta to help more. For now though just take the lunesta.   Insomnia Insomnia is frequent trouble falling and/or staying asleep. Insomnia can be a long term problem or a short term problem. Both are common. Insomnia can be a short term problem when the wakefulness is related to a certain stress or worry. Long term insomnia is often related to ongoing stress during waking hours and/or poor sleeping habits. Overtime, sleep deprivation itself can make the problem worse. Every little thing feels more severe because you are overtired and your ability to cope is decreased. CAUSES   Stress, anxiety, and depression.  Poor sleeping habits.  Distractions such as TV in the bedroom.  Naps close to bedtime.  Engaging in emotionally charged conversations before bed.  Technical reading before sleep.  Alcohol and other sedatives. They may make the problem worse. They can hurt normal sleep patterns and normal dream activity.  Stimulants such as caffeine for several hours prior to bedtime.  Pain syndromes and shortness of breath can cause insomnia.  Exercise late at night.  Changing time zones may cause sleeping problems (jet lag). It is sometimes helpful to have someone observe your sleeping patterns. They should look for periods of not breathing during the night (sleep apnea). They should also look to see how long those periods last. If you live alone or observers are uncertain, you can also be observed at a sleep clinic where your sleep patterns will be professionally monitored. Sleep apnea requires a  checkup and treatment. Give your caregivers your medical history. Give your caregivers observations your family has made about your sleep.  SYMPTOMS   Not feeling rested in the morning.  Anxiety and restlessness at bedtime.  Difficulty falling and staying asleep. TREATMENT   Your caregiver may prescribe treatment for an underlying medical disorders. Your caregiver can give advice or help if you are using alcohol or other drugs for self-medication. Treatment of underlying problems will usually eliminate insomnia problems.  Medications can be prescribed for short time use. They are generally not recommended for lengthy use.  Over-the-counter sleep medicines are not recommended for lengthy use. They can be habit forming.  You can promote easier sleeping by making lifestyle changes such as:  Using relaxation techniques that help with breathing and reduce muscle tension.  Exercising earlier in the day.  Changing your diet and the time of your last meal. No night time snacks.  Establish a regular time to go to bed.  Counseling can help with stressful problems and worry.  Soothing music and white noise may be helpful if there are background noises you cannot remove.  Stop tedious detailed work at least one hour before bedtime. HOME CARE INSTRUCTIONS   Keep a diary. Inform your caregiver about your progress. This includes any medication side effects. See your caregiver regularly. Take note of:  Times when you are asleep.  Times when you are awake during the night.  The quality of your sleep.  How you feel the next day. This information will help  your caregiver care for you.  Get out of bed if you are still awake after 15 minutes. Read or do some quiet activity. Keep the lights down. Wait until you feel sleepy and go back to bed.  Keep regular sleeping and waking hours. Avoid naps.  Exercise regularly.  Avoid distractions at bedtime. Distractions include watching television  or engaging in any intense or detailed activity like attempting to balance the household checkbook.  Develop a bedtime ritual. Keep a familiar routine of bathing, brushing your teeth, climbing into bed at the same time each night, listening to soothing music. Routines increase the success of falling to sleep faster.  Use relaxation techniques. This can be using breathing and muscle tension release routines. It can also include visualizing peaceful scenes. You can also help control troubling or intruding thoughts by keeping your mind occupied with boring or repetitive thoughts like the old concept of counting sheep. You can make it more creative like imagining planting one beautiful flower after another in your backyard garden.  During your day, work to eliminate stress. When this is not possible use some of the previous suggestions to help reduce the anxiety that accompanies stressful situations. MAKE SURE YOU:   Understand these instructions.  Will watch your condition.  Will get help right away if you are not doing well or get worse. Document Released: 05/03/2000 Document Revised: 07/29/2011 Document Reviewed: 06/03/2007 [facility] Patient Information 2015 [place], [place]. This information is not intended to replace advice given to you by your health care provider. Make sure you discuss any questions you have with your health care provider.

## 2014-09-06 ENCOUNTER — Encounter: Payer: Self-pay | Admitting: Certified Nurse Midwife

## 2014-09-06 ENCOUNTER — Ambulatory Visit (INDEPENDENT_AMBULATORY_CARE_PROVIDER_SITE_OTHER): Payer: 59 | Admitting: Certified Nurse Midwife

## 2014-09-06 VITALS — BP 94/62 | HR 80 | Resp 20 | Ht 61.25 in | Wt 153.0 lb

## 2014-09-06 DIAGNOSIS — R319 Hematuria, unspecified: Secondary | ICD-10-CM

## 2014-09-06 DIAGNOSIS — Z0000 Encounter for general adult medical examination without abnormal findings: Secondary | ICD-10-CM

## 2014-09-06 DIAGNOSIS — Z01419 Encounter for gynecological examination (general) (routine) without abnormal findings: Secondary | ICD-10-CM | POA: Diagnosis not present

## 2014-09-06 LAB — POCT URINALYSIS DIPSTICK
Bilirubin, UA: NEGATIVE
Glucose, UA: NEGATIVE
KETONES UA: NEGATIVE
LEUKOCYTES UA: NEGATIVE
Nitrite, UA: NEGATIVE
Protein, UA: NEGATIVE
Urobilinogen, UA: NEGATIVE
pH, UA: 5

## 2014-09-06 NOTE — Patient Instructions (Signed)

## 2014-09-06 NOTE — Progress Notes (Signed)
54 y.o. [id number] Married  African American Fe here for annual exam. Menopausal no HRT. Denies vaginal bleeding or vaginal dryness. Has lost 26 pounds and working out with [employer] and weights. Denies urinary symptoms of blood in urine, frequency, or urgency.. Does wear thongs with work out. Sees PCP for aex, medication management for depression/hypertension and asthma, labs. 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 Planning to obtain certification in exercise/ health to work at if needed. No health issues today.  Patient's last menstrual period was 12/18/2005.          Sexually active: Yes.    The current method of family planning is status post hysterectomy.    Exercising: Yes.    zumba,martial arts, walking & weights Smoker:  no  Health Maintenance: Pap: 1/10 neg MMG:  09-16-13 f/u left breast category b density, birads 3:prob benign, 39mth f/u 05-05-14 left breast category a density,birads 1:neg Colonoscopy:  2010 f/u 40yrs BMD:   none TDaP:  2015 Labs: Poct urine-rbc tr Self breast exam: done monthly   reports that she has never smoked. She has never used smokeless tobacco. She reports that she drinks about 1.2 - 1.8 oz of alcohol per week. She reports that she does not use illicit drugs.  Past Medical History  Diagnosis Date  . DEPRESSION     PTSD complication, working at [employer]  . GLAUCOMA   . HYPERTENSION   . GERD   . ARTHRITIS   . Migraine   . Anxiety   . Abnormal Pap smear 1984    Cryo  . Heart palpitations 03/2013  . Vertigo     Past Surgical History  Procedure Laterality Date  . Cesarean section  1990  . Bilateral breast reduction  1998  . Skin graft  2006  . Bladder tack  2008  . Abdominal hysterectomy  2008    TVH  . L3-5 decompression  06/2009  . Breast surgery    . Spine surgery    . Tubal ligation  1/90  . Cervix lesion destruction  1984  . Shoulder surgery Right     Current Outpatient Prescriptions   Medication Sig Dispense Refill  . albuterol (PROVENTIL HFA;VENTOLIN HFA) 108 (90 BASE) MCG/ACT inhaler Inhale 2 puffs into the lungs every 4 (four) hours as needed for wheezing or shortness of breath (cough, shortness of breath or wheezing.). 1 Inhaler 0  . aspirin EC 81 MG tablet Take 81 mg by mouth daily.    . Eszopiclone 3 MG TABS Take 1 tablet (3 mg total) by mouth at bedtime. Take immediately before bedtime 30 tablet 2  . gabapentin (NEURONTIN) 400 MG capsule Take 1 capsule (400 mg total) by mouth 3 (three) times daily. 90 capsule 6  . Multiple Vitamins-Minerals (MULTIVITAMIN & MINERAL PO) Take by mouth daily. Alive 50+ MVI.    Marland Kitchen traZODone (DESYREL) 100 MG tablet Take 1 tablet (100 mg total) by mouth at bedtime. 100-200mg  30 tablet 6  . Vilazodone HCl (VIIBRYD) 40 MG TABS Take 1 tablet (40 mg total) by mouth daily. 90 tablet 3   No current facility-administered medications for this visit.    Family History  Problem Relation Age of Onset  . Arthritis Mother   . Diabetes Mother   . Dementia Mother   . Stroke Mother   . Hypertension Mother   . Alzheimer's disease Mother   . Arthritis Father   . Cancer Father     prostate, metastatic turned  into bone  . Stroke Father   . Hypertension Father   . Alcohol abuse Sister     committed suicide  . Diabetes Maternal Grandmother   . Cancer Maternal Grandfather   . Cancer Paternal Grandmother   . Cancer Paternal Grandfather   . Coronary artery disease  77    Grandfather with MI    ROS:  Pertinent items are noted in HPI.  Otherwise, a comprehensive ROS was negative.  Exam:   BP 94/62 mmHg  Pulse 80  Resp 20  Ht 5' 1.25" (1.556 m)  Wt 153 lb (69.4 kg)  BMI 28.66 kg/m2  LMP 12/18/2005 Height: 5' 1.25" (155.6 cm) Ht Readings from Last 3 Encounters:  09/06/14 5' 1.25" (1.556 m)  08/06/14 5' 1.5" (1.562 m)  08/01/14 5' 1.5" (1.562 m)    General appearance: alert, cooperative and appears stated age Head: Normocephalic, without  obvious abnormality, atraumatic Neck: no adenopathy, supple, symmetrical, trachea midline and thyroid normal to inspection and palpation Lungs: clear to auscultation bilaterally CVAT bilateral negative Breasts: normal appearance, no masses or tenderness, No nipple retraction or dimpling, No nipple discharge or bleeding, No axillary or supraclavicular adenopathy Heart: regular rate and rhythm Abdomen: soft, non-tender; no masses,  no organomegaly, negative suprapubic Extremities: extremities normal, atraumatic, no cyanosis or edema Skin: Skin color, texture, turgor normal. No rashes or lesions, warm and dry Lymph nodes: Cervical, supraclavicular, and axillary nodes normal. No abnormal inguinal nodes palpated Neurologic: Grossly normal   Pelvic: External genitalia:  no lesions              Urethra:  normal appearing urethra with no masses, tenderness or lesions  Bladder and urethral meatus non tender              Bartholin's and Skene's: normal                 Vagina: normal appearing vagina with normal color and discharge, no lesions              Cervix: absent              Pap taken: No. Bimanual Exam:  Uterus:  uterus absent              Adnexa: normal adnexa and no mass, fullness, tenderness               Rectovaginal: Confirms               Anus:  normal sphincter tone, no lesions  Chaperone present: Yes  A:  Well Woman with normal exam  Menopausal no HRT, s/p TVH, prolapse  Previous left breast follow up for focal opacity,no longer present, regular screening now due in 5/16  R/O UTI  Hypertension/depression/asthma with PCP management.  Social stress with potential job loss, but has other option in mind  P:   Reviewed health and wellness pertinent to exam  Aware of need to advise if vaginal dryness issues  Encouraged patient to increase water intake and aware of UTI symptoms.  Lab: Urine micro/culture  Continue follow up with PCP as indicated. Discussed exercise as a good  plan for depression control also.  Pap smear not  taken today  counseled on breast self exam, mammography screening, adequate intake of calcium and vitamin D, diet and exercise  return annually or prn  An After Visit Summary was printed and given to the patient.

## 2014-09-07 LAB — URINALYSIS, MICROSCOPIC ONLY
Bacteria, UA: NONE SEEN
CRYSTALS: NONE SEEN
Casts: NONE SEEN
SQUAMOUS EPITHELIAL / LPF: NONE SEEN

## 2014-09-07 NOTE — Progress Notes (Signed)
Reviewed personally.  M. Suzanne Caniyah Murley, MD.  

## 2014-09-08 LAB — URINE CULTURE
Colony Count: NO GROWTH
Organism ID, Bacteria: NO GROWTH

## 2014-11-09 ENCOUNTER — Ambulatory Visit (INDEPENDENT_AMBULATORY_CARE_PROVIDER_SITE_OTHER): Payer: 59 | Admitting: Family Medicine

## 2014-11-09 VITALS — BP 120/80 | HR 74 | Temp 98.6°F | Resp 18 | Ht 62.0 in | Wt 151.0 lb

## 2014-11-09 DIAGNOSIS — R110 Nausea: Secondary | ICD-10-CM

## 2014-11-09 DIAGNOSIS — G43109 Migraine with aura, not intractable, without status migrainosus: Secondary | ICD-10-CM

## 2014-11-09 MED ORDER — KETOROLAC TROMETHAMINE 60 MG/2ML IM SOLN
60.0000 mg | Freq: Once | INTRAMUSCULAR | Status: AC
  Administered 2014-11-09: 60 mg via INTRAMUSCULAR

## 2014-11-09 NOTE — Progress Notes (Signed)
Subjective:    Patient ID: Rachael Fleming, female    DOB: Jul 16, 1960, 54 y.o.   MRN: [id number] This chart was scribed for Merri Ray, MD by Zola Button, Medical Scribe. This patient was seen in Room 14 and the patient's care was started at 6:39 PM.   HPI HPI Comments: Rachael Fleming is a 54 y.o. female with a hx of migraines who presents to the [facility] complaining of a left-sided migraine headache that started last night.  She was last treated for migraine here in March, 2015. At that time, she was treated with phenergan and Nubain injection.   The headache has since radiated to the right side. Patient reports having some associated nausea, scalp tingling and photophobia. She has taken Pepto bismol, Tums, Zofran, and three Excedrin. She last took the Excedrin around 2:00 PM and the Zofran around 3:00 PM. She still has Zofran at home if needed for nausea. Patient denies fever, visual changes, extremity weakness, slurred speech, and vomiting. She also denies kidney problems. She states her current headache is similar to prior migraine headaches.  Patient Active Problem List   Diagnosis Date Noted  . Insomnia 08/29/2014  . Syncope 04/03/2014  . Obesity (BMI 30-39.9) 04/03/2014  . Chronic post-traumatic stress disorder (PTSD) 04/03/2014  . Palpitations 12/04/2012  . Depression 09/28/2008  . GLAUCOMA 09/28/2008  . Essential hypertension 09/28/2008  . ARTHRITIS 09/28/2008   Past Medical History  Diagnosis Date  . DEPRESSION     PTSD complication, working at [employer]  . GLAUCOMA   . HYPERTENSION   . GERD   . ARTHRITIS   . Migraine   . Anxiety   . Abnormal Pap smear 1984    Cryo  . Heart palpitations 03/2013  . Vertigo    Past Surgical History  Procedure Laterality Date  . Cesarean section  1990  . Bilateral breast reduction  1998  . Skin graft  2006  . Bladder tack  2008  . Abdominal hysterectomy  2008    TVH  . L3-5 decompression   06/2009  . Breast surgery    . Spine surgery    . Tubal ligation  1/90  . Cervix lesion destruction  1984  . Shoulder surgery Right    Allergies  Allergen Reactions  . Penicillins     REACTION: Nausea \\T \ vomitting   Prior to Admission medications   Medication Sig Start Date End Date Taking? Authorizing Provider  albuterol (PROVENTIL HFA;VENTOLIN HFA) 108 (90 BASE) MCG/ACT inhaler Inhale 2 puffs into the lungs every 4 (four) hours as needed for wheezing or shortness of breath (cough, shortness of breath or wheezing.). 08/06/14  Yes Shawnee Knapp, MD  Eszopiclone 3 MG TABS Take 1 tablet (3 mg total) by mouth at bedtime. Take immediately before bedtime 08/29/14  Yes Olga Millers, MD  gabapentin (NEURONTIN) 400 MG capsule Take 1 capsule (400 mg total) by mouth 3 (three) times daily. 06/08/14  Yes Olga Millers, MD  Multiple Vitamins-Minerals (MULTIVITAMIN & MINERAL PO) Take by mouth daily. Alive 50+ MVI.   Yes Historical Provider, MD  traZODone (DESYREL) 100 MG tablet Take 1 tablet (100 mg total) by mouth at bedtime. 100-200mg  06/08/14  Yes Olga Millers, MD  Vilazodone HCl (VIIBRYD) 40 MG TABS Take 1 tablet (40 mg total) by mouth daily. 08/29/14  Yes Olga Millers, MD  aspirin EC 81 MG tablet Take 81 mg by mouth daily.    Historical  Provider, MD   History   Social History  . Marital Status: Married    Spouse Name: N/A  . Number of Children: N/A  . Years of Education: N/A   Occupational History  . Not on file.   Social History Main Topics  . Smoking status: Never Smoker   . 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 employed @ [employer] 3rd shift  . Alcohol Use: 1.2 - 1.8 oz/week    2-3 Standard drinks or equivalent per week     Comment: Social  . Drug Use: No  . Sexual Activity:    Partners: Male    Patent examiner Protection: Surgical     Comment: hysterectomy   Other Topics Concern  . Not on file   Social History Narrative       Review of Systems  Constitutional: Negative for fever.  Eyes: Positive for photophobia. Negative for visual disturbance.  Gastrointestinal: Positive for nausea. Negative for vomiting.  Neurological: Positive for headaches. Negative for speech difficulty and weakness.       Objective:   Physical Exam  Constitutional: She is oriented to person, place, and time. She appears well-developed and well-nourished. No distress.  HENT:  Head: Normocephalic and atraumatic.  Mouth/Throat: Oropharynx is clear and moist. No oropharyngeal exudate.  Eyes: EOM are normal. Pupils are equal, round, and reactive to light.  Neck: Neck supple.  Cardiovascular: Normal rate, regular rhythm and normal heart sounds.  Exam reveals no gallop and no friction rub.   No murmur heard. Pulmonary/Chest: Effort normal and breath sounds normal. No respiratory distress. She has no wheezes. She has no rales.  Clear to auscultation bilaterally.   Musculoskeletal: She exhibits no edema.  Neurological: She is alert and oriented to person, place, and time. No cranial nerve deficit. She displays a negative Romberg sign.  Non-focal. Negative romberg. No pronator drift. Normal heel-to-toe. Normal finger-to-nose.  Skin: Skin is warm and dry. No rash noted.  Psychiatric: She has a normal mood and affect. Her behavior is normal.  Vitals reviewed.     Filed Vitals:   11/09/14 1802  BP: 120/80  Pulse: 74  Temp: 98.6 F (37 C)  TempSrc: Oral  Resp: 18  Height: 5\' 2"  (1.575 m)  Weight: 151 lb (68.493 kg)  SpO2: 97%       Assessment & Plan:   Rachael Fleming is a 54 y.o. female Migraine with aura and without status migrainosus, not intractable - Plan: ketorolac (TORADOL) injection 60 mg  Nausea without vomiting  Typical migraine, but unrelieved with home treatments. Tolerated Toradol in past.   -Toradol 60mg  IM x 1.   -continue Zofran at home as needed for nausea, rest, and RTC/ER precautions if persists or  worsens. Understanding expressed.    Meds ordered this encounter  Medications  . ketorolac (TORADOL) injection 60 mg    Sig:    Patient Instructions  Toradol injection given for headache/pain. Continue Zofran if needed for nausea, and rest tonight.  Return to the clinic or go to the nearest emergency room if any of your symptoms worsen or new symptoms occur.  Migraine Headache A migraine headache is an intense, throbbing pain on one or both sides of your head. A migraine can last for 30 minutes to several hours. CAUSES  The exact cause of a migraine headache is not always known. However, a migraine may be caused when nerves in the brain become irritated and release chemicals that  cause inflammation. This causes pain. Certain things may also trigger migraines, such as:  Alcohol.  Smoking.  Stress.  Menstruation.  Aged cheeses.  Foods or drinks that contain nitrates, glutamate, aspartame, or tyramine.  Lack of sleep.  Chocolate.  Caffeine.  Hunger.  Physical exertion.  Fatigue.  Medicines used to treat chest pain (nitroglycerine), birth control pills, estrogen, and some blood pressure medicines. SIGNS AND SYMPTOMS  Pain on one or both sides of your head.  Pulsating or throbbing pain.  Severe pain that prevents daily activities.  Pain that is aggravated by any physical activity.  Nausea, vomiting, or both.  Dizziness.  Pain with exposure to bright lights, loud noises, or activity.  General sensitivity to bright lights, loud noises, or smells. Before you get a migraine, you may get warning signs that a migraine is coming (aura). An aura may include:  Seeing flashing lights.  Seeing bright spots, halos, or zigzag lines.  Having tunnel vision or blurred vision.  Having feelings of numbness or tingling.  Having trouble talking.  Having muscle weakness. DIAGNOSIS  A migraine headache is often diagnosed based on:  Symptoms.  Physical exam.  A CT  scan or MRI of your head. These imaging tests cannot diagnose migraines, but they can help rule out other causes of headaches. TREATMENT Medicines may be given for pain and nausea. Medicines can also be given to help prevent recurrent migraines.  HOME CARE INSTRUCTIONS  Only take over-the-counter or prescription medicines for pain or discomfort as directed by your health care provider. The use of long-term narcotics is not recommended.  Lie down in a dark, quiet room when you have a migraine.  Keep a journal to find out what may trigger your migraine headaches. For example, write down:  What you eat and drink.  How much sleep you get.  Any change to your diet or medicines.  Limit alcohol consumption.  Quit smoking if you smoke.  Get 7-9 hours of sleep, or as recommended by your health care provider.  Limit stress.  Keep lights dim if bright lights bother you and make your migraines worse. SEEK IMMEDIATE MEDICAL CARE IF:   Your migraine becomes severe.  You have a fever.  You have a stiff neck.  You have vision loss.  You have muscular weakness or loss of muscle control.  You start losing your balance or have trouble walking.  You feel faint or pass out.  You have severe symptoms that are different from your first symptoms. MAKE SURE YOU:   Understand these instructions.  Will watch your condition.  Will get help right away if you are not doing well or get worse. Document Released: 05/06/2005 Document Revised: 09/20/2013 Document Reviewed: 01/11/2013 [facility] Patient Information 2015 [place], [place]. This information is not intended to replace advice given to you by your health care provider. Make sure you discuss any questions you have with your health care provider.     I personally performed the services described in this documentation, which was scribed in my presence. The recorded information has been reviewed and considered, and addended by me as needed.

## 2014-11-09 NOTE — Patient Instructions (Signed)
Toradol injection given for headache/pain. Continue Zofran if needed for nausea, and rest tonight.  Return to the clinic or go to the nearest emergency room if any of your symptoms worsen or new symptoms occur.  Migraine Headache A migraine headache is an intense, throbbing pain on one or both sides of your head. A migraine can last for 30 minutes to several hours. CAUSES  The exact cause of a migraine headache is not always known. However, a migraine may be caused when nerves in the brain become irritated and release chemicals that cause inflammation. This causes pain. Certain things may also trigger migraines, such as:  Alcohol.  Smoking.  Stress.  Menstruation.  Aged cheeses.  Foods or drinks that contain nitrates, glutamate, aspartame, or tyramine.  Lack of sleep.  Chocolate.  Caffeine.  Hunger.  Physical exertion.  Fatigue.  Medicines used to treat chest pain (nitroglycerine), birth control pills, estrogen, and some blood pressure medicines. SIGNS AND SYMPTOMS  Pain on one or both sides of your head.  Pulsating or throbbing pain.  Severe pain that prevents daily activities.  Pain that is aggravated by any physical activity.  Nausea, vomiting, or both.  Dizziness.  Pain with exposure to bright lights, loud noises, or activity.  General sensitivity to bright lights, loud noises, or smells. Before you get a migraine, you may get warning signs that a migraine is coming (aura). An aura may include:  Seeing flashing lights.  Seeing bright spots, halos, or zigzag lines.  Having tunnel vision or blurred vision.  Having feelings of numbness or tingling.  Having trouble talking.  Having muscle weakness. DIAGNOSIS  A migraine headache is often diagnosed based on:  Symptoms.  Physical exam.  A CT scan or MRI of your head. These imaging tests cannot diagnose migraines, but they can help rule out other causes of headaches. TREATMENT Medicines may be  given for pain and nausea. Medicines can also be given to help prevent recurrent migraines.  HOME CARE INSTRUCTIONS  Only take over-the-counter or prescription medicines for pain or discomfort as directed by your health care provider. The use of long-term narcotics is not recommended.  Lie down in a dark, quiet room when you have a migraine.  Keep a journal to find out what may trigger your migraine headaches. For example, write down:  What you eat and drink.  How much sleep you get.  Any change to your diet or medicines.  Limit alcohol consumption.  Quit smoking if you smoke.  Get 7-9 hours of sleep, or as recommended by your health care provider.  Limit stress.  Keep lights dim if bright lights bother you and make your migraines worse. SEEK IMMEDIATE MEDICAL CARE IF:   Your migraine becomes severe.  You have a fever.  You have a stiff neck.  You have vision loss.  You have muscular weakness or loss of muscle control.  You start losing your balance or have trouble walking.  You feel faint or pass out.  You have severe symptoms that are different from your first symptoms. MAKE SURE YOU:   Understand these instructions.  Will watch your condition.  Will get help right away if you are not doing well or get worse. Document Released: 05/06/2005 Document Revised: 09/20/2013 Document Reviewed: 01/11/2013 [facility] Patient Information 2015 [place], [place]. This information is not intended to replace advice given to you by your health care provider. Make sure you discuss any questions you have with your health care provider.

## 2014-11-14 ENCOUNTER — Ambulatory Visit: Payer: 59 | Admitting: Internal Medicine

## 2014-11-24 ENCOUNTER — Encounter: Payer: Self-pay | Admitting: Internal Medicine

## 2014-11-24 ENCOUNTER — Ambulatory Visit (INDEPENDENT_AMBULATORY_CARE_PROVIDER_SITE_OTHER): Payer: Self-pay | Admitting: Internal Medicine

## 2014-11-24 VITALS — BP 122/80 | HR 68 | Temp 97.9°F | Ht 62.0 in | Wt 149.5 lb

## 2014-11-24 DIAGNOSIS — F329 Major depressive disorder, single episode, unspecified: Secondary | ICD-10-CM

## 2014-11-24 DIAGNOSIS — F32A Depression, unspecified: Secondary | ICD-10-CM

## 2014-11-24 MED ORDER — PAROXETINE HCL 20 MG PO TABS: 20.0000 mg | ORAL_TABLET | Freq: Every day | ORAL | Status: DC

## 2014-11-24 MED ORDER — GABAPENTIN 400 MG PO CAPS: 400.0000 mg | ORAL_CAPSULE | Freq: Three times a day (TID) | ORAL | Status: DC

## 2014-11-24 MED ORDER — ESZOPICLONE 3 MG PO TABS: 3.0000 mg | ORAL_TABLET | Freq: Every day | ORAL | Status: DC

## 2014-11-24 MED ORDER — TRAZODONE HCL 100 MG PO TABS
100.0000 mg | ORAL_TABLET | Freq: Every day | ORAL | Status: DC
Start: 2014-11-24 — End: 2014-12-12

## 2014-11-24 NOTE — Assessment & Plan Note (Signed)
Will taper off viibryd over the next week and start paroxetine (on $4 list at [place]). Can continue taking trazodone for sleep and given coupon from [facility] for the lunesta to help with sleep. Advised if worsening depression to call or seek help. Offered mental help at [facility] which is free 24 hour mental help.

## 2014-11-24 NOTE — Progress Notes (Signed)
   Subjective:    Patient ID: Rachael Fleming, female    DOB: 1960-09-06, 54 y.o.   MRN: [id number]  HPI The patient is a 54 YO female coming in for follow up of her insomnia. She did lose her job in the recent merger at her job as she suspected she might. She is still having some difficulty adjusting to that. This is giving her more difficulty with sleep. Taking 2 trazodone and 1 lunesta at night to sleep. Getting to sleep okay but waking up in the middle of the night. Still taking viibryd but will not be able to afford it and needs to switch. Has not been tried on many things before that.   Review of Systems  Constitutional: Negative.   Respiratory: Negative.   Cardiovascular: Negative.   Neurological: Negative.   Psychiatric/Behavioral: Positive for sleep disturbance and dysphoric mood. Negative for suicidal ideas, behavioral problems, self-injury and decreased concentration.      Objective:   Physical Exam  Constitutional: She is oriented to person, place, and time. She appears well-developed and well-nourished.  HENT:  Head: Normocephalic and atraumatic.  Cardiovascular: Normal rate and regular rhythm.   Pulmonary/Chest: Effort normal and breath sounds normal.  Neurological: She is alert and oriented to person, place, and time. Coordination normal.  Skin: Skin is warm and dry.  Psychiatric: She has a normal mood and affect.   Filed Vitals:   11/24/14 1031  BP: 122/80  Pulse: 68  Temp: 97.9 F (36.6 C)  TempSrc: Oral  Height: 5\' 2"  (1.575 m)  Weight: 149 lb 8 oz (67.813 kg)  SpO2: 98%      Assessment & Plan:

## 2014-11-24 NOTE — Progress Notes (Signed)
Pre visit review using our clinic review tool, if applicable. No additional management support is needed unless otherwise documented below in the visit note. 

## 2014-11-24 NOTE — Patient Instructions (Addendum)
We will have you switch to 1/2 pill of the viibryd daily for the next 1 week then you can stop. Once you are on day 3 of 1/2 pill you can start taking the new paroxetine. Take 1 pill a day and it may take 2-3 weeks to get to full effect for you.   [facility] are $4 for 1 month supply.   I have given you a coupon for the lunesta that you can take with you to the pharmacy, you may have to call around to find the best deal with the coupon. I have also given you the prescription. This is from the website [facility] and you can access it on your phone or computer to get another card if you need it.

## 2014-12-07 ENCOUNTER — Ambulatory Visit: Payer: 59 | Admitting: Internal Medicine

## 2014-12-12 ENCOUNTER — Telehealth: Payer: Self-pay | Admitting: Internal Medicine

## 2014-12-12 ENCOUNTER — Other Ambulatory Visit: Payer: Self-pay | Admitting: Geriatric Medicine

## 2014-12-12 MED ORDER — PAROXETINE HCL 20 MG PO TABS: 20.0000 mg | ORAL_TABLET | Freq: Every day | ORAL | Status: DC

## 2014-12-12 MED ORDER — TRAZODONE HCL 100 MG PO TABS: 100.0000 mg | ORAL_TABLET | Freq: Every day | ORAL | Status: DC

## 2014-12-12 NOTE — Telephone Encounter (Signed)
Pt called in said that she lost her script for Trazodone and Paroxetine.  She wants to know if she can get other one?  She said that she has never gotten it filled.    [facility] [address]

## 2014-12-12 NOTE — Telephone Encounter (Signed)
Sent in to pharmacy.  

## 2014-12-12 NOTE — Telephone Encounter (Signed)
Is it ok to refill? 

## 2014-12-22 ENCOUNTER — Ambulatory Visit: Payer: Self-pay | Admitting: Internal Medicine

## 2015-01-19 ENCOUNTER — Telehealth: Payer: Self-pay | Admitting: *Deleted

## 2015-01-19 NOTE — Telephone Encounter (Signed)
Pt is due for recall bilateral mammogram due to previous small focal opacity seen in left breast.  Last MMG:  05/05/14  IMPRESSION: No evidence of malignancy. Normal exam. (Small focal opacity seen in previous study no longer visible.)  RECOMMENDATION: Screening mammogram in 6 months to return to normal annual screening schedule.  Pt overdue for mammogram recall.  Due June 2016.  Follow up appointment has not been made.  Please call patient to schedule.  [facility].

## 2015-01-19 NOTE — Telephone Encounter (Signed)
Patient states she got laid off work and will find a way to get her MMG done.

## 2015-01-20 NOTE — Telephone Encounter (Signed)
Please review recommendation and advise recall.  Letter has not been created.

## 2015-01-20 NOTE — Telephone Encounter (Signed)
Please call breast center and see what other resources are available for this pt.   CC:  Rachael Fleming

## 2015-01-24 NOTE — Telephone Encounter (Signed)
Spoke with Nevin Bloodgood at [facility]. Nevin Bloodgood reviewed patient's history and states that she is eligible to apply for the scholarship program. Nevin Bloodgood will fax over scholarship application to our office for patient.

## 2015-01-24 NOTE — Telephone Encounter (Signed)
Spoke with Aldona Bar at [facility] who states patient could apply for a scholarship program through [facility] or could go through the [facility] program for follow up.  Left message for Gabriel Cirri at the [facility] program to discuss what may be needed for patient. Left message for Nevin Bloodgood at [facility] to discuss scholarship program for patient.

## 2015-01-24 NOTE — Telephone Encounter (Signed)
Left message to call [place] at [phone].  I have received faxed forms for the Scholarship program at [facility]. Need to discuss with patient.

## 2015-01-31 ENCOUNTER — Other Ambulatory Visit: Payer: Self-pay | Admitting: Internal Medicine

## 2015-01-31 NOTE — Telephone Encounter (Signed)
Left message to call Rachael Fleming at [phone]. 

## 2015-02-02 NOTE — Telephone Encounter (Signed)
Left message to call Pawan Knechtel at [phone]. 

## 2015-02-06 NOTE — Telephone Encounter (Signed)
Returned call

## 2015-02-07 NOTE — Telephone Encounter (Signed)
Left message to call Kaitlyn at [phone]. 

## 2015-02-13 ENCOUNTER — Other Ambulatory Visit: Payer: Self-pay | Admitting: Internal Medicine

## 2015-02-15 NOTE — Telephone Encounter (Signed)
Spoke with patient. Advised of the mammogram scholarship fund through [facility]. Patient is agreeable and will come by the office to pick up the form. Aware she will need to fill this out and return this to [facility] with information provided on the form. From there she will be contact and scheduled if she qualifies. Form left at the front desk for patient pick up.  Routing back to [employer], [place] regarding mammogram hold.  Cc: Dr.Miller

## 2015-02-15 NOTE — Telephone Encounter (Signed)
This is Dr. Quincy Simmonds reviewing Dr. Ammie Ferrier in box.  I recommend keeping patient in mammogram hold at this time.   [place], [place] Rachael Fleming

## 2015-02-16 NOTE — Telephone Encounter (Signed)
Recall extended to 02/2015. Closing encounter.

## 2015-02-16 NOTE — Telephone Encounter (Signed)
Mammogram hold placed.

## 2015-02-19 ENCOUNTER — Other Ambulatory Visit: Payer: Self-pay | Admitting: Internal Medicine

## 2015-02-22 ENCOUNTER — Ambulatory Visit (INDEPENDENT_AMBULATORY_CARE_PROVIDER_SITE_OTHER): Payer: No Typology Code available for payment source | Admitting: Family Medicine

## 2015-02-22 VITALS — BP 110/70 | HR 64 | Temp 97.9°F | Resp 17 | Ht 62.0 in | Wt 144.2 lb

## 2015-02-22 DIAGNOSIS — G43109 Migraine with aura, not intractable, without status migrainosus: Secondary | ICD-10-CM

## 2015-02-22 DIAGNOSIS — R110 Nausea: Secondary | ICD-10-CM | POA: Diagnosis not present

## 2015-02-22 MED ORDER — ONDANSETRON HCL 4 MG/2ML IJ SOLN: 4.0000 mg | Freq: Once | INTRAMUSCULAR | Status: DC

## 2015-02-22 MED ORDER — ONDANSETRON 8 MG PO TBDP: 8.0000 mg | ORAL_TABLET | Freq: Three times a day (TID) | ORAL | Status: DC | PRN

## 2015-02-22 MED ORDER — KETOROLAC TROMETHAMINE 60 MG/2ML IM SOLN
60.0000 mg | Freq: Once | INTRAMUSCULAR | Status: AC
  Administered 2015-02-22: 60 mg via INTRAMUSCULAR

## 2015-02-22 MED ORDER — PROMETHAZINE HCL 25 MG/ML IJ SOLN
25.0000 mg | Freq: Once | INTRAMUSCULAR | Status: AC
  Administered 2015-02-22: 25 mg via INTRAMUSCULAR

## 2015-02-22 MED ORDER — ONDANSETRON 4 MG PO TBDP: 8.0000 mg | ORAL_TABLET | Freq: Once | ORAL | Status: DC

## 2015-02-22 MED ORDER — DIPHENHYDRAMINE HCL 25 MG PO CAPS
25.0000 mg | ORAL_CAPSULE | Freq: Once | ORAL | Status: AC
  Administered 2015-02-22: 25 mg via ORAL

## 2015-02-22 NOTE — Progress Notes (Signed)
[facility] [address], [place] [address] [phone]  Date:  02/22/2015   Name:  Rachael Fleming   DOB:  [birth year]   MRN:  [id number]  PCP:  Hoyt Koch, MD    Chief Complaint: Migraine and Nausea   History of Present Illness:  Rachael Fleming is a 54 y.o. very pleasant female patient who presents with the following: Migraine:  - Began 3 days ago - Taking Excedrin tension HA, 3 at a time. Last dose around 6:30 am - 8/10 pain.  - Last treated for migraine in 10/2014 - Currently behind right eye.  Pain wants to "knock a hole out in her eye".   - Has tried pressure points and ice.  - No longer has zofran. Has not taken any doses during this illness.   - Very nauseated.  Has tried pepto bismol or tums.   - + photophobia.  +/- phonophobia - Denies fevers, chills, or night sweats, confusion.   - No emesis. Dry heaves.   - Similar to past migraines  - Has no head trauma.  - Here with significant other.     Patient Active Problem List   Diagnosis Date Noted  . Insomnia 08/29/2014  . Syncope 04/03/2014  . Obesity (BMI 30-39.9) 04/03/2014  . Chronic post-traumatic stress disorder (PTSD) 04/03/2014  . Palpitations 12/04/2012  . Depression 09/28/2008  . GLAUCOMA 09/28/2008  . Essential hypertension 09/28/2008  . ARTHRITIS 09/28/2008    Past Medical History  Diagnosis Date  . DEPRESSION     PTSD complication, working at [employer]  . GLAUCOMA   . HYPERTENSION   . GERD   . ARTHRITIS   . Migraine   . Anxiety   . Abnormal Pap smear 1984    [facility]  . Heart palpitations 03/2013  . Vertigo     Past Surgical History  Procedure Laterality Date  . Cesarean section  1990  . Bilateral breast reduction  1998  . Skin graft  2006  . Bladder tack  2008  . Abdominal hysterectomy  2008    TVH  . L3-5 decompression  06/2009  . Breast surgery    . Spine surgery    . Tubal ligation  1/90  . Cervix lesion destruction  1984  .  Shoulder surgery Right     Social History  Substance Use Topics  . Smoking status: Never Smoker   . 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 employed @ [employer] 3rd shift  . Alcohol Use: 1.2 - 1.8 oz/week    2-3 Standard drinks or equivalent per week     Comment: Social    Family History  Problem Relation Age of Onset  . Arthritis Mother   . Diabetes Mother   . Dementia Mother   . Stroke Mother   . Hypertension Mother   . Alzheimer's disease Mother   . Arthritis Father   . Cancer Father     prostate, metastatic turned into bone  . Stroke Father   . Hypertension Father   . Alcohol abuse Sister     committed suicide  . Diabetes Maternal Grandmother   . Cancer Maternal Grandfather   . Cancer Paternal Grandmother   . Cancer Paternal Grandfather   . Coronary artery disease  38    Grandfather with MI    Allergies  Allergen Reactions  . Penicillins     REACTION: Nausea \\T \ vomitting  Medication list has been reviewed and updated.  Current Outpatient Prescriptions on File Prior to Visit  Medication Sig Dispense Refill  . albuterol (PROVENTIL HFA;VENTOLIN HFA) 108 (90 BASE) MCG/ACT inhaler Inhale 2 puffs into the lungs every 4 (four) hours as needed for wheezing or shortness of breath (cough, shortness of breath or wheezing.). 1 Inhaler 0  . aspirin EC 81 MG tablet Take 81 mg by mouth daily.    . Eszopiclone 3 MG TABS TAKE 1 TABLET BY MOUTH EVERY NIGHT AT BEDTIME TAKE IMMEDIATELY BEFORE BEDTIME 30 tablet 1  . gabapentin (NEURONTIN) 400 MG capsule Take 1 capsule (400 mg total) by mouth 3 (three) times daily. 90 capsule 6  . Multiple Vitamins-Minerals (MULTIVITAMIN & MINERAL PO) Take by mouth daily. Alive 50+ MVI.    Marland Kitchen PARoxetine (PAXIL) 20 MG tablet Take 1 tablet (20 mg total) by mouth daily. 30 tablet 6  . traZODone (DESYREL) 100 MG tablet Take 1 tablet (100 mg total) by mouth at bedtime. 100-200mg  30 tablet 6  . Vilazodone HCl (VIIBRYD) 40 MG  TABS Take 1 tablet (40 mg total) by mouth daily. (Patient not taking: Reported on 02/22/2015) 90 tablet 3   No current facility-administered medications on file prior to visit.    Review of Systems: Review of Systems  Constitutional: Negative for fever and chills.  HENT: Negative for congestion, hearing loss and tinnitus.   Eyes: Positive for photophobia and pain. Negative for blurred vision and double vision.  Respiratory: Negative for cough, shortness of breath and wheezing.   Cardiovascular: Negative for chest pain and palpitations.  Gastrointestinal: Positive for nausea. Negative for vomiting, abdominal pain, diarrhea, constipation and blood in stool.  Musculoskeletal: Negative for myalgias.  Skin: Negative for rash.  Neurological: Positive for headaches. Negative for dizziness, tingling, sensory change, speech change, seizures and loss of consciousness.    Physical Examination: Filed Vitals:   02/22/15 1758  BP: 110/70  Pulse: 64  Temp: 97.9 F (36.6 C)  Resp: 17   Filed Vitals:   02/22/15 1758  Height: 5\' 2"  (1.575 m)  Weight: 144 lb 4 oz (65.431 kg)   Body mass index is 26.38 kg/(m^2). Ideal Body Weight: Weight in (lb) to have BMI = 25: 136.4  Constitutional: She is oriented to person, place, and time. She appears well-developed and well-nourished. No distress.  HENT:  Head: Normocephalic and atraumatic.  Mouth/Throat: Oropharynx is clear and moist.  Eyes: EOM are normal. Pupils are equal, round, and reactive to light.  Neck: Neck supple.  Cardiovascular: Normal rate, regular rhythm and normal heart sounds. Exam reveals no gallop and no friction rub.  No murmur heard. Pulmonary/Chest: Effort normal and breath sounds normal. No respiratory distress. She has no wheezes. She has no rales.  Musculoskeletal: She exhibits no edema.  Neurological: She is alert and oriented to person, place, and time. No cranial nerve deficit. She displays a negative Romberg sign.   Non-focal. No pronator drift. Normal finger-to-nose.  Skin: Skin is warm and dry. No rash noted.  Psychiatric: She has a normal mood and affect. Her behavior is normal.  Vitals reviewed.  Assessment and Plan: Typical migraine, but unrelieved with home treatments. Tolerated Toradol and phenergan in the past. Reviewed QTc in 03/2014: 428.  -Toradol 60mg  IM x 1.              - Phenergan 25mg  IM x1               - Benadryl 25 mg PO  x1 - Given Rx for zofran ODTs.  Advised not to use zofran for at least 4 hours after phenergan inj.               - Given return precautions.   Felt well following the above medications.  Going few with husband.   Signed Gerre Pebbles, MD

## 2015-02-22 NOTE — Patient Instructions (Signed)

## 2015-03-13 ENCOUNTER — Other Ambulatory Visit: Payer: Self-pay | Admitting: Internal Medicine

## 2015-03-16 ENCOUNTER — Ambulatory Visit (INDEPENDENT_AMBULATORY_CARE_PROVIDER_SITE_OTHER): Payer: PRIVATE HEALTH INSURANCE | Admitting: Internal Medicine

## 2015-03-16 ENCOUNTER — Encounter: Payer: Self-pay | Admitting: Internal Medicine

## 2015-03-16 VITALS — BP 110/72 | HR 59 | Temp 97.9°F | Resp 12 | Ht 61.5 in | Wt 146.0 lb

## 2015-03-16 DIAGNOSIS — F329 Major depressive disorder, single episode, unspecified: Secondary | ICD-10-CM

## 2015-03-16 DIAGNOSIS — F32A Depression, unspecified: Secondary | ICD-10-CM

## 2015-03-16 MED ORDER — ESZOPICLONE 3 MG PO TABS: ORAL_TABLET | ORAL | Status: DC

## 2015-03-16 MED ORDER — PAROXETINE HCL 20 MG PO TABS: 20.0000 mg | ORAL_TABLET | Freq: Every day | ORAL | Status: DC

## 2015-03-16 NOTE — Progress Notes (Signed)
Pre visit review using our clinic review tool, if applicable. No additional management support is needed unless otherwise documented below in the visit note. 

## 2015-03-16 NOTE — Patient Instructions (Signed)
We have given you the refill of the lunesta and sent in the refill of the paxil.   I'm glad to see that you are doing better today.   We have also given you a sheet with exercises to help with the vertigo. You can also use a medicine over the counter called meclizine that helps with vertigo (12.5 or 25 mg for vertigo).

## 2015-03-17 ENCOUNTER — Encounter: Payer: Self-pay | Admitting: Internal Medicine

## 2015-03-17 NOTE — Assessment & Plan Note (Signed)
Did well with switch to paxil. Will continue therapy and reinforced activities that she can do at home to help with her mood including exercise.

## 2015-03-17 NOTE — Progress Notes (Signed)
   Subjective:    Patient ID: Rachael Fleming, female    DOB: [birth year], 54 y.o.   MRN: [id number]  HPI The patient is a 54 YO female coming in for follow up of her depression. We did switch her viibryd to paxil at last visit. She was able to make that transition. She did not have worsening symptoms and still feels well controlled. She has a part time job right now which helps with her stress levels. No SI/HI and sleeping okay.   Review of Systems  Constitutional: Negative.   Respiratory: Negative.   Cardiovascular: Negative.   Neurological: Negative.   Psychiatric/Behavioral: Positive for sleep disturbance. Negative for suicidal ideas, behavioral problems, self-injury, dysphoric mood and decreased concentration.      Objective:   Physical Exam  Constitutional: She is oriented to person, place, and time. She appears well-developed and well-nourished.  HENT:  Head: Normocephalic and atraumatic.  Cardiovascular: Normal rate and regular rhythm.   Pulmonary/Chest: Effort normal and breath sounds normal.  Neurological: She is alert and oriented to person, place, and time. Coordination normal.  Skin: Skin is warm and dry.  Psychiatric: She has a normal mood and affect.   Filed Vitals:   03/16/15 1304  BP: 110/72  Pulse: 59  Temp: 97.9 F (36.6 C)  TempSrc: Oral  Resp: 12  Height: 5' 1.5" (1.562 m)  Weight: 146 lb (66.225 kg)  SpO2: 97%      Assessment & Plan:

## 2015-03-19 NOTE — Progress Notes (Signed)
History and physical examinations reviewed in detail with Dr. Jimmye Norman. Agree with assessment and plan as outlined. Neira Bentsen Elayne Guerin, M.D. [facility] [address] [place], [place]  [address] [phone] phone [phone] fax

## 2015-04-10 ENCOUNTER — Ambulatory Visit (INDEPENDENT_AMBULATORY_CARE_PROVIDER_SITE_OTHER): Payer: No Typology Code available for payment source | Admitting: Emergency Medicine

## 2015-04-10 VITALS — BP 100/62 | HR 62 | Temp 98.1°F | Resp 18 | Ht 62.5 in | Wt 141.0 lb

## 2015-04-10 DIAGNOSIS — A09 Infectious gastroenteritis and colitis, unspecified: Secondary | ICD-10-CM | POA: Diagnosis not present

## 2015-04-10 LAB — POCT CBC
GRANULOCYTE PERCENT: 55.7 % (ref 37–80)
HCT, POC: 37.8 % (ref 37.7–47.9)
HEMOGLOBIN: 12.9 g/dL (ref 12.2–16.2)
Lymph, poc: 1.4 (ref 0.6–3.4)
MCH: 30.6 pg (ref 27–31.2)
MCHC: 34 g/dL (ref 31.8–35.4)
MCV: 89.9 fL (ref 80–97)
MID (cbc): 0.4 (ref 0–0.9)
MPV: 7.4 fL (ref 0–99.8)
PLATELET COUNT, POC: 182 10*3/uL (ref 142–424)
POC Granulocyte: 2.3 (ref 2–6.9)
POC LYMPH PERCENT: 33.7 %L (ref 10–50)
POC MID %: 10.6 % (ref 0–12)
RBC: 4.2 M/uL (ref 4.04–5.48)
RDW, POC: 16.6 %
WBC: 4.1 10*3/uL — AB (ref 4.6–10.2)

## 2015-04-10 LAB — COMPREHENSIVE METABOLIC PANEL
ALK PHOS: 82 U/L (ref 33–130)
ALT: 22 U/L (ref 6–29)
AST: 29 U/L (ref 10–35)
Albumin: 3.8 g/dL (ref 3.6–5.1)
BUN: 11 mg/dL (ref 7–25)
CALCIUM: 9.2 mg/dL (ref 8.6–10.4)
CO2: 28 mmol/L (ref 20–31)
Chloride: 104 mmol/L (ref 98–110)
Creat: 0.73 mg/dL (ref 0.50–1.05)
GLUCOSE: 82 mg/dL (ref 65–99)
POTASSIUM: 4.3 mmol/L (ref 3.5–5.3)
Sodium: 141 mmol/L (ref 135–146)
Total Bilirubin: 0.3 mg/dL (ref 0.2–1.2)
Total Protein: 6.5 g/dL (ref 6.1–8.1)

## 2015-04-10 MED ORDER — LOPERAMIDE HCL 2 MG PO TABS: ORAL_TABLET | ORAL | Status: DC

## 2015-04-10 NOTE — Patient Instructions (Addendum)
Viral Gastroenteritis Viral gastroenteritis is also known as stomach flu. This condition affects the stomach and intestinal tract. It can cause sudden diarrhea and vomiting. The illness typically lasts 3 to 8 days. Most people develop an immune response that eventually gets rid of the virus. While this natural response develops, the virus can make you quite ill. CAUSES  Many different viruses can cause gastroenteritis, such as rotavirus or noroviruses. You can catch one of these viruses by consuming contaminated food or water. You may also catch a virus by sharing utensils or other personal items with an infected person or by touching a contaminated surface. SYMPTOMS  The most common symptoms are diarrhea and vomiting. These problems can cause a severe loss of body fluids (dehydration) and a body salt (electrolyte) imbalance. Other symptoms may include:  Fever.  Headache.  Fatigue.  Abdominal pain. DIAGNOSIS  Your caregiver can usually diagnose viral gastroenteritis based on your symptoms and a physical exam. A stool sample may also be taken to test for the presence of viruses or other infections. TREATMENT  This illness typically goes away on its own. Treatments are aimed at rehydration. The most serious cases of viral gastroenteritis involve vomiting so severely that you are not able to keep fluids down. In these cases, fluids must be given through an intravenous line (IV). HOME CARE INSTRUCTIONS   Drink enough fluids to keep your urine clear or pale yellow. Drink small amounts of fluids frequently and increase the amounts as tolerated.  Ask your caregiver for specific rehydration instructions.  Avoid:  Foods high in sugar.  Alcohol.  Carbonated drinks.  Tobacco.  Juice.  Caffeine drinks.  Extremely hot or cold fluids.  Fatty, greasy foods.  Too much intake of anything at one time.  Dairy products until 24 to 48 hours after diarrhea stops.  You may consume probiotics.  Probiotics are active cultures of beneficial bacteria. They may lessen the amount and number of diarrheal stools in adults. Probiotics can be found in yogurt with active cultures and in supplements.  Wash your hands well to avoid spreading the virus.  Only take over-the-counter or prescription medicines for pain, discomfort, or fever as directed by your caregiver. Do not give aspirin to children. Antidiarrheal medicines are not recommended.  Ask your caregiver if you should continue to take your regular prescribed and over-the-counter medicines.  Keep all follow-up appointments as directed by your caregiver. SEEK IMMEDIATE MEDICAL CARE IF:   You are unable to keep fluids down.  You do not urinate at least once every 6 to 8 hours.  You develop shortness of breath.  You notice blood in your stool or vomit. This may look like coffee grounds.  You have abdominal pain that increases or is concentrated in one small area (localized).  You have persistent vomiting or diarrhea.  You have a fever.  The patient is a child younger than 3 months, and he or she has a fever.  The patient is a child older than 3 months, and he or she has a fever and persistent symptoms.  The patient is a child older than 3 months, and he or she has a fever and symptoms suddenly get worse.  The patient is a baby, and he or she has no tears when crying. MAKE SURE YOU:   Understand these instructions.  Will watch your condition.  Will get help right away if you are not doing well or get worse.   This information is not intended to replace  advice given to you by your health care provider. Make sure you discuss any questions you have with your health care provider.   Document Released: 05/06/2005 Document Revised: 07/29/2011 Document Reviewed: 02/20/2011 Elsevier Interactive Patient Education 2016 [place] Liquid Diet A clear liquid diet is a short-term diet that is prescribed to provide the  necessary fluid and basic energy you need when you can have nothing else. The clear liquid diet consists of liquids or solids that will become liquid at room temperature. You should be able to see through the liquid. There are many reasons that you may be restricted to clear liquids, such as:  When you have a sudden-onset (acute) condition that occurs before or after surgery.  To help your body slowly get adjusted to food again after a long period when you were unable to have food.  Replacement of fluids when you have a diarrheal disease.  When you are going to have certain exams, such as a colonoscopy, in which instruments are inserted inside your body to look at parts of your digestive system. WHAT CAN I HAVE? A clear liquid diet does not provide all the nutrients you need. It is important to choose a variety of the following items to get as many nutrients as possible:  Vegetable juices that do not have pulp.  Fruit juices and fruit drinks that do not have pulp.  Coffee (regular or decaffeinated), tea, or soda at the discretion of your health care provider.  Clear bouillon, broth, or strained broth-based soups.  High-protein and flavored gelatins.  Sugar or honey.  Ices or frozen ice pops that do not contain milk. If you are not sure whether you can have certain items, you should ask your health care provider. You may also ask your health care provider if there are any other clear liquid options.   This information is not intended to replace advice given to you by your health care provider. Make sure you discuss any questions you have with your health care provider.   Document Released: 05/06/2005 Document Revised: 05/11/2013 Document Reviewed: 04/02/2013 Elsevier Interactive Patient Education [employer].

## 2015-04-10 NOTE — Progress Notes (Signed)
Subjective:  Patient ID: Rachael Fleming, female    DOB: March 30, 1961  Age: 54 y.o. MRN: [id number]  CC: food poison; Diarrhea; Fatigue; Headache; Nausea; and Dizziness   HPI Rachael Fleming presents  patient been ill since Friday with nausea 1-2 episodes of vomiting. She has had loose stools. He has a headache and fatigue. She somewhat dizzy at times. She's had poor by mouth intake. She's had no fever or chills. 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 She's had no foreign travel or consumption of questionable water. She's had no blood mucus or pus or stool. She's been using Pepto-Bismol with no improvement in her symptoms. She has no abdominal pain dysuria urgency or frequency  History Rachael Fleming has a past medical history of DEPRESSION; GLAUCOMA; HYPERTENSION; GERD; ARTHRITIS; Migraine; Anxiety; Abnormal Pap smear (1984); Heart palpitations (03/2013); and Vertigo.   She has past surgical history that includes Cesarean section (1990); Bilateral breast reduction (1998); Skin graft (2006); bladder tack (2008); Abdominal hysterectomy (2008); L3-5 decompression (06/2009); Breast surgery; Spine surgery; Tubal ligation (1/90); Cervix lesion destruction (1984); and Shoulder surgery (Right).   Her  family history includes Alcohol abuse in her sister; Alzheimer's disease in her mother; Arthritis in her father and mother; Cancer in her father, maternal grandfather, paternal grandfather, and paternal grandmother; Coronary artery disease (age of onset: 62) in an other family member; Dementia in her mother; Diabetes in her maternal grandmother and mother; Hypertension in her father and mother; Stroke in her father and mother.  She   reports that she has never smoked. She has never used smokeless tobacco. She reports that she drinks about 1.2 - 1.8 oz of alcohol per week. She reports that she does not use illicit drugs.  Outpatient Prescriptions Prior to  Visit  Medication Sig Dispense Refill  . albuterol (PROVENTIL HFA;VENTOLIN HFA) 108 (90 BASE) MCG/ACT inhaler Inhale 2 puffs into the lungs every 4 (four) hours as needed for wheezing or shortness of breath (cough, shortness of breath or wheezing.). 1 Inhaler 0  . aspirin EC 81 MG tablet Take 81 mg by mouth daily.    . Eszopiclone 3 MG TABS TAKE 1 TABLET BY MOUTH EVERY NIGHT AT BEDTIME TAKE IMMEDIATELY BEFORE BEDTIME 30 tablet 5  . gabapentin (NEURONTIN) 400 MG capsule Take 1 capsule (400 mg total) by mouth 3 (three) times daily. 90 capsule 6  . Multiple Vitamins-Minerals (MULTIVITAMIN & MINERAL PO) Take by mouth daily. Alive 50+ MVI.    Marland Kitchen ondansetron (ZOFRAN-ODT) 8 MG disintegrating tablet Take 1 tablet (8 mg total) by mouth every 8 (eight) hours as needed for nausea. 20 tablet 0  . PARoxetine (PAXIL) 20 MG tablet Take 1 tablet (20 mg total) by mouth daily. 30 tablet 6  . traZODone (DESYREL) 100 MG tablet Take 1 tablet (100 mg total) by mouth at bedtime. 100-200mg  30 tablet 6   No facility-administered medications prior to visit.    Social History   Social History  . Marital Status: Married    Spouse Name: N/A  . Number of Children: N/A  . Years of Education: N/A   Social History Main Topics  . Smoking status: Never Smoker   . 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 employed @ [employer] 3rd shift  . Alcohol Use: 1.2 - 1.8 oz/week    2-3 Standard drinks or equivalent per week     Comment: Social  . Drug  Use: No  . Sexual Activity:    Partners: Male    Birth Control/ Protection: Surgical     Comment: hysterectomy   Other Topics Concern  . None   Social History Narrative     Review of Systems  Constitutional: Positive for fatigue. Negative for fever, chills and appetite change.  HENT: Negative for congestion, ear pain, postnasal drip, sinus pressure and sore throat.   Eyes: Negative for pain and redness.  Respiratory: Negative for cough,  shortness of breath and wheezing.   Cardiovascular: Negative for leg swelling.  Gastrointestinal: Positive for nausea, vomiting, abdominal pain, diarrhea and abdominal distention. Negative for constipation and blood in stool.  Endocrine: Negative for polyuria.  Genitourinary: Negative for dysuria, urgency, frequency and flank pain.  Musculoskeletal: Negative for gait problem.  Skin: Negative for rash.  Neurological: Negative for weakness and headaches.  Psychiatric/Behavioral: Negative for confusion and decreased concentration. The patient is not nervous/anxious.     Objective:  BP 100/62 mmHg  Pulse 62  Temp(Src) 98.1 F (36.7 C) (Oral)  Resp 18  Ht 5' 2.5" (1.588 m)  Wt 141 lb (63.957 kg)  BMI 25.36 kg/m2  SpO2 97%  LMP 12/18/2005  Physical Exam  Constitutional: She is oriented to person, place, and time. She appears well-developed and well-nourished. No distress.  HENT:  Head: Normocephalic and atraumatic.  Right Ear: External ear normal.  Left Ear: External ear normal.  Nose: Nose normal.  Eyes: Conjunctivae and EOM are normal. Pupils are equal, round, and reactive to light. No scleral icterus.  Neck: Normal range of motion. Neck supple. No tracheal deviation present.  Cardiovascular: Normal rate, regular rhythm and normal heart sounds.   Pulmonary/Chest: Effort normal. No respiratory distress. She has no wheezes. She has no rales.  Abdominal: She exhibits no mass. There is no tenderness. There is no rebound and no guarding.  Musculoskeletal: She exhibits no edema.  Lymphadenopathy:    She has no cervical adenopathy.  Neurological: She is alert and oriented to person, place, and time. Coordination normal.  Skin: Skin is warm and dry. No rash noted.  Psychiatric: She has a normal mood and affect. Her behavior is normal.      Assessment & Plan:   Rachael Fleming was seen today for food poison, diarrhea, fatigue, headache, nausea and dizziness.  Diagnoses and all orders  for this visit:  Infectious enteritis, unspecified infectious agent -     POCT CBC -     Comprehensive metabolic panel  Other orders -     loperamide (IMODIUM A-D) 2 MG tablet; 2 now and one hourly prn diarrhea.  Max 8 tabs in 24 hours  I am having Rachael Fleming start on loperamide. I am also having her maintain her Multiple Vitamins-Minerals (MULTIVITAMIN & MINERAL PO), aspirin EC, albuterol, gabapentin, traZODone, ondansetron, Eszopiclone, and PARoxetine.  Meds ordered this encounter  Medications  . loperamide (IMODIUM A-D) 2 MG tablet    Sig: 2 now and one hourly prn diarrhea.  Max 8 tabs in 24 hours    Dispense:  30 tablet    Refill:  0    Appropriate red flag conditions were discussed with the patient as well as actions that should be taken.  Patient expressed his understanding.  Follow-up: Return if symptoms worsen or fail to improve.  Roselee Culver, MD   Results for orders placed or performed in visit on 04/10/15  POCT CBC  Result Value Ref Range   WBC 4.1 (A) 4.6 - 10.2  K/uL   Lymph, poc 1.4 0.6 - 3.4   POC LYMPH PERCENT 33.7 10 - 50 %L   MID (cbc) 0.4 0 - 0.9   POC MID % 10.6 0 - 12 %M   POC Granulocyte 2.3 2 - 6.9   Granulocyte percent 55.7 37 - 80 %G   RBC 4.20 4.04 - 5.48 M/uL   Hemoglobin 12.9 12.2 - 16.2 g/dL   HCT, POC 37.8 37.7 - 47.9 %   MCV 89.9 80 - 97 fL   MCH, POC 30.6 27 - 31.2 pg   MCHC 34.0 31.8 - 35.4 g/dL   RDW, POC 16.6 %   Platelet Count, POC 182 142 - 424 K/uL   MPV 7.4 0 - 99.8 fL

## 2015-04-26 ENCOUNTER — Telehealth: Payer: Self-pay | Admitting: Emergency Medicine

## 2015-04-26 NOTE — Telephone Encounter (Signed)
Call to patient. She is in mammogram hold. Advised calling to follow up with assistance for mammogram scheduling. Patient aware of mammogram scholarship program with the [facility].  Patient states "I know I really need to do that, I really know this is important and I will do it." Patient declines my assistance with scheduling.   Routing update to Melvia Heaps CNM and Dr. Sabra Heck and will close encounter.

## 2015-04-27 ENCOUNTER — Other Ambulatory Visit: Payer: Self-pay

## 2015-04-27 DIAGNOSIS — Z1231 Encounter for screening mammogram for malignant neoplasm of breast: Secondary | ICD-10-CM

## 2015-04-28 ENCOUNTER — Ambulatory Visit (INDEPENDENT_AMBULATORY_CARE_PROVIDER_SITE_OTHER): Payer: No Typology Code available for payment source | Admitting: Emergency Medicine

## 2015-04-28 VITALS — BP 110/68 | HR 86 | Temp 98.6°F | Resp 16 | Ht 62.5 in | Wt 139.6 lb

## 2015-04-28 DIAGNOSIS — J209 Acute bronchitis, unspecified: Secondary | ICD-10-CM | POA: Diagnosis not present

## 2015-04-28 MED ORDER — AZITHROMYCIN 250 MG PO TABS: ORAL_TABLET | ORAL | Status: DC

## 2015-04-28 MED ORDER — HYDROCOD POLST-CPM POLST ER 10-8 MG/5ML PO SUER: 5.0000 mL | Freq: Two times a day (BID) | ORAL | Status: DC

## 2015-04-28 NOTE — Progress Notes (Signed)
Subjective:  Patient ID: Rachael Fleming, female    DOB: Oct 12, 1960  Age: 54 y.o. MRN: [id number]  CC: Cough and Shortness of Breath   HPI Rachael Fleming presents  with a history of asthma and has been using her albuterol inhaler intermittently. She has some increased cough with scant. Mucopurulent sputum production she. She has occasional wheezing. She has no fever chills no nausea vomiting. No stool change or rash she has had no improvement with her home treatment. She has no nasal congestion postnasal drainage or sore throat.  History [name] has a past medical history of DEPRESSION; GLAUCOMA; HYPERTENSION; GERD; ARTHRITIS; Migraine; Anxiety; Abnormal Pap smear (1984); Heart palpitations (03/2013); and Vertigo.   She has past surgical history that includes Cesarean section (1990); Bilateral breast reduction (1998); Skin graft (2006); bladder tack (2008); Abdominal hysterectomy (2008); L3-5 decompression (06/2009); Breast surgery; Spine surgery; Tubal ligation (1/90); Cervix lesion destruction (1984); and Shoulder surgery (Right).   Her  family history includes Alcohol abuse in her sister; Alzheimer's disease in her mother; Arthritis in her father and mother; Cancer in her father, maternal grandfather, paternal grandfather, and paternal grandmother; Coronary artery disease (age of onset: 27) in an other family member; Dementia in her mother; Diabetes in her maternal grandmother and mother; Hypertension in her father and mother; Stroke in her father and mother.  She   reports that she has never smoked. She has never used smokeless tobacco. She reports that she drinks about 1.2 - 1.8 oz of alcohol per week. She reports that she does not use illicit drugs.  Outpatient Prescriptions Prior to Visit  Medication Sig Dispense Refill  . albuterol (PROVENTIL HFA;VENTOLIN HFA) 108 (90 BASE) MCG/ACT inhaler Inhale 2 puffs into the lungs every 4 (four) hours as needed for wheezing or shortness  of breath (cough, shortness of breath or wheezing.). 1 Inhaler 0  . aspirin EC 81 MG tablet Take 81 mg by mouth daily.    . Eszopiclone 3 MG TABS TAKE 1 TABLET BY MOUTH EVERY NIGHT AT BEDTIME TAKE IMMEDIATELY BEFORE BEDTIME 30 tablet 5  . gabapentin (NEURONTIN) 400 MG capsule Take 1 capsule (400 mg total) by mouth 3 (three) times daily. 90 capsule 6  . Multiple Vitamins-Minerals (MULTIVITAMIN & MINERAL PO) Take by mouth daily. Alive 50+ MVI.    Marland Kitchen PARoxetine (PAXIL) 20 MG tablet Take 1 tablet (20 mg total) by mouth daily. 30 tablet 6  . traZODone (DESYREL) 100 MG tablet Take 1 tablet (100 mg total) by mouth at bedtime. 100-200mg  30 tablet 6  . loperamide (IMODIUM A-D) 2 MG tablet 2 now and one hourly prn diarrhea.  Max 8 tabs in 24 hours (Patient not taking: Reported on 04/28/2015) 30 tablet 0  . ondansetron (ZOFRAN-ODT) 8 MG disintegrating tablet Take 1 tablet (8 mg total) by mouth every 8 (eight) hours as needed for nausea. (Patient not taking: Reported on 04/28/2015) 20 tablet 0   No facility-administered medications prior to visit.    Social History   Social History  . Marital Status: Married    Spouse Name: N/A  . Number of Children: N/A  . Years of Education: N/A   Social History Main Topics  . Smoking status: Never Smoker   . 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 employed @ [employer] 3rd shift  . Alcohol Use: 1.2 - 1.8 oz/week    2-3 Standard drinks or equivalent per week     Comment: Social  .  Drug Use: No  . Sexual Activity:    Partners: Male    Birth Control/ Protection: Surgical     Comment: hysterectomy   Other Topics Concern  . None   Social History Narrative     Review of Systems  Constitutional: Negative for fever, chills and appetite change.  HENT: Negative for congestion, ear pain, postnasal drip, sinus pressure and sore throat.   Eyes: Negative for pain and redness.  Respiratory: Positive for cough. Negative for shortness of  breath and wheezing.   Cardiovascular: Negative for leg swelling.  Gastrointestinal: Negative for nausea, vomiting, abdominal pain, diarrhea, constipation and blood in stool.  Endocrine: Negative for polyuria.  Genitourinary: Negative for dysuria, urgency, frequency and flank pain.  Musculoskeletal: Negative for gait problem.  Skin: Negative for rash.  Neurological: Negative for weakness and headaches.  Psychiatric/Behavioral: Negative for confusion and decreased concentration. The patient is not nervous/anxious.     Objective:  BP 110/68 mmHg  Pulse 86  Temp(Src) 98.6 F (37 C) (Oral)  Resp 16  Ht 5' 2.5" (1.588 m)  Wt 139 lb 9.6 oz (63.322 kg)  BMI 25.11 kg/m2  SpO2 95%  LMP 12/18/2005  Physical Exam  Constitutional: She is oriented to person, place, and time. She appears well-developed and well-nourished. No distress.  HENT:  Head: Normocephalic and atraumatic.  Right Ear: External ear normal.  Left Ear: External ear normal.  Nose: Nose normal.  Eyes: Conjunctivae and EOM are normal. Pupils are equal, round, and reactive to light. No scleral icterus.  Neck: Normal range of motion. Neck supple. No tracheal deviation present.  Cardiovascular: Normal rate, regular rhythm and normal heart sounds.   Pulmonary/Chest: Effort normal. No respiratory distress. She has no wheezes. She has no rales.  Abdominal: She exhibits no mass. There is no tenderness. There is no rebound and no guarding.  Musculoskeletal: She exhibits no edema.  Lymphadenopathy:    She has no cervical adenopathy.  Neurological: She is alert and oriented to person, place, and time. Coordination normal.  Skin: Skin is warm and dry. No rash noted.  Psychiatric: She has a normal mood and affect. Her behavior is normal.      Assessment & Plan:   Stina was seen today for cough and shortness of breath.  Diagnoses and all orders for this visit:  Acute bronchitis, unspecified organism  Other orders -      azithromycin (ZITHROMAX) 250 MG tablet; Take 2 tabs PO x 1 dose, then 1 tab PO QD x 4 days -     chlorpheniramine-HYDROcodone (TUSSIONEX PENNKINETIC ER) 10-8 MG/5ML SUER; Take 5 mLs by mouth 2 (two) times daily.  I am having Ms. Fellenz start on azithromycin and chlorpheniramine-HYDROcodone. I am also having her maintain her Multiple Vitamins-Minerals (MULTIVITAMIN & MINERAL PO), aspirin EC, albuterol, gabapentin, traZODone, ondansetron, Eszopiclone, PARoxetine, and loperamide.  Meds ordered this encounter  Medications  . azithromycin (ZITHROMAX) 250 MG tablet    Sig: Take 2 tabs PO x 1 dose, then 1 tab PO QD x 4 days    Dispense:  6 tablet    Refill:  0  . chlorpheniramine-HYDROcodone (TUSSIONEX PENNKINETIC ER) 10-8 MG/5ML SUER    Sig: Take 5 mLs by mouth 2 (two) times daily.    Dispense:  60 mL    Refill:  0    Appropriate red flag conditions were discussed with the patient as well as actions that should be taken.  Patient expressed his understanding.  Follow-up: Return if symptoms worsen  or fail to improve.  Roselee Culver, MD

## 2015-04-28 NOTE — Patient Instructions (Signed)

## 2015-05-04 ENCOUNTER — Ambulatory Visit (INDEPENDENT_AMBULATORY_CARE_PROVIDER_SITE_OTHER): Payer: No Typology Code available for payment source

## 2015-05-04 ENCOUNTER — Ambulatory Visit (INDEPENDENT_AMBULATORY_CARE_PROVIDER_SITE_OTHER): Payer: No Typology Code available for payment source | Admitting: Physician Assistant

## 2015-05-04 VITALS — BP 108/70 | HR 71 | Temp 98.3°F | Resp 18 | Ht 62.5 in | Wt 143.0 lb

## 2015-05-04 DIAGNOSIS — R05 Cough: Secondary | ICD-10-CM | POA: Diagnosis not present

## 2015-05-04 DIAGNOSIS — J208 Acute bronchitis due to other specified organisms: Secondary | ICD-10-CM | POA: Diagnosis not present

## 2015-05-04 DIAGNOSIS — R0600 Dyspnea, unspecified: Secondary | ICD-10-CM

## 2015-05-04 DIAGNOSIS — R059 Cough, unspecified: Secondary | ICD-10-CM

## 2015-05-04 LAB — POCT CBC
Granulocyte percent: 64.1 %G (ref 37–80)
HCT, POC: 36.8 % — AB (ref 37.7–47.9)
HEMOGLOBIN: 12.8 g/dL (ref 12.2–16.2)
LYMPH, POC: 2.3 (ref 0.6–3.4)
MCH: 31.3 pg — AB (ref 27–31.2)
MCHC: 34.6 g/dL (ref 31.8–35.4)
MCV: 90.4 fL (ref 80–97)
MID (CBC): 0.2 (ref 0–0.9)
MPV: 7 fL (ref 0–99.8)
PLATELET COUNT, POC: 247 10*3/uL (ref 142–424)
POC Granulocyte: 4.5 (ref 2–6.9)
POC LYMPH PERCENT: 32.4 %L (ref 10–50)
POC MID %: 3.5 %M (ref 0–12)
RBC: 4.08 M/uL (ref 4.04–5.48)
RDW, POC: 16.7 %
WBC: 7 10*3/uL (ref 4.6–10.2)

## 2015-05-04 MED ORDER — PREDNISONE 20 MG PO TABS: ORAL_TABLET | ORAL | Status: DC

## 2015-05-04 MED ORDER — ALBUTEROL SULFATE (2.5 MG/3ML) 0.083% IN NEBU
2.5000 mg | INHALATION_SOLUTION | Freq: Once | RESPIRATORY_TRACT | Status: AC
  Administered 2015-05-04: 2.5 mg via RESPIRATORY_TRACT

## 2015-05-04 MED ORDER — BENZONATATE 100 MG PO CAPS: 100.0000 mg | ORAL_CAPSULE | Freq: Three times a day (TID) | ORAL | Status: DC | PRN

## 2015-05-04 MED ORDER — IPRATROPIUM BROMIDE 0.02 % IN SOLN
0.5000 mg | Freq: Once | RESPIRATORY_TRACT | Status: AC
  Administered 2015-05-04: 0.5 mg via RESPIRATORY_TRACT

## 2015-05-04 MED ORDER — ALBUTEROL SULFATE HFA 108 (90 BASE) MCG/ACT IN AERS: 2.0000 | INHALATION_SPRAY | RESPIRATORY_TRACT | Status: DC | PRN

## 2015-05-04 NOTE — Progress Notes (Signed)
[facility] [address], [place] [address] [phone]  Date:  05/04/2015   Name:  Rachael Fleming   DOB:  1961-03-20   MRN:  [id number]  PCP:  Hoyt Koch, MD    History of Present Illness:  Rachael Fleming is a 54 y.o. female patient who presents to [facility] for cc of cough.  This has become progressively worsened.  She cough to the point of light headedness.  Hard to speak due to the cough.  She has used the tussionex which has not been useful. No hx of chest pains.    She works as an Administrator, [employer].  No blood sputum.   Patient Active Problem List   Diagnosis Date Noted  . Insomnia 08/29/2014  . Syncope 04/03/2014  . Chronic post-traumatic stress disorder (PTSD) 04/03/2014  . Palpitations 12/04/2012  . Depression 09/28/2008  . GLAUCOMA 09/28/2008  . Essential hypertension 09/28/2008  . ARTHRITIS 09/28/2008    Past Medical History  Diagnosis Date  . DEPRESSION     PTSD complication, working at [employer]  . GLAUCOMA   . HYPERTENSION   . GERD   . ARTHRITIS   . Migraine   . Anxiety   . Abnormal Pap smear 1984    Cryo  . Heart palpitations 03/2013  . Vertigo     Past Surgical History  Procedure Laterality Date  . Cesarean section  1990  . Bilateral breast reduction  1998  . Skin graft  2006  . Bladder tack  2008  . Abdominal hysterectomy  2008    TVH  . L3-5 decompression  06/2009  . Breast surgery    . Spine surgery    . Tubal ligation  1/90  . Cervix lesion destruction  1984  . Shoulder surgery Right     Social History  Substance Use Topics  . Smoking status: Never Smoker   . 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 Alcohol Use: 1.2 - 1.8 oz/week    2-3 Standard drinks or equivalent per week     Comment: Social    Family History  Problem Relation Age of Onset  . Arthritis Mother   . Diabetes Mother   . Dementia Mother   . Stroke Mother    . Hypertension Mother   . Alzheimer's disease Mother   . Arthritis Father   . Cancer Father     prostate, metastatic turned into bone  . Stroke Father   . Hypertension Father   . Alcohol abuse Sister     committed suicide  . Diabetes Maternal Grandmother   . Cancer Maternal Grandfather   . Cancer Paternal Grandmother   . Cancer Paternal Grandfather   . Coronary artery disease  33    Grandfather with MI    Allergies  Allergen Reactions  . Penicillins     REACTION: Nausea \\T \ vomitting    Medication list has been reviewed and updated.  Current Outpatient Prescriptions on File Prior to Visit  Medication Sig Dispense Refill  . albuterol (PROVENTIL HFA;VENTOLIN HFA) 108 (90 BASE) MCG/ACT inhaler Inhale 2 puffs into the lungs every 4 (four) hours as needed for wheezing or shortness of breath (cough, shortness of breath or wheezing.). 1 Inhaler 0  . aspirin EC 81 MG tablet Take 81 mg by mouth daily.    . Eszopiclone 3 MG TABS TAKE 1 TABLET BY MOUTH EVERY  NIGHT AT BEDTIME TAKE IMMEDIATELY BEFORE BEDTIME 30 tablet 5  . gabapentin (NEURONTIN) 400 MG capsule Take 1 capsule (400 mg total) by mouth 3 (three) times daily. 90 capsule 6  . loperamide (IMODIUM A-D) 2 MG tablet 2 now and one hourly prn diarrhea.  Max 8 tabs in 24 hours 30 tablet 0  . Multiple Vitamins-Minerals (MULTIVITAMIN & MINERAL PO) Take by mouth daily. Alive 50+ MVI.    Marland Kitchen ondansetron (ZOFRAN-ODT) 8 MG disintegrating tablet Take 1 tablet (8 mg total) by mouth every 8 (eight) hours as needed for nausea. 20 tablet 0  . PARoxetine (PAXIL) 20 MG tablet Take 1 tablet (20 mg total) by mouth daily. 30 tablet 6  . traZODone (DESYREL) 100 MG tablet Take 1 tablet (100 mg total) by mouth at bedtime. 100-200mg  30 tablet 6  . azithromycin (ZITHROMAX) 250 MG tablet Take 2 tabs PO x 1 dose, then 1 tab PO QD x 4 days (Patient not taking: Reported on 05/04/2015) 6 tablet 0  . chlorpheniramine-HYDROcodone (TUSSIONEX PENNKINETIC ER) 10-8  MG/5ML SUER Take 5 mLs by mouth 2 (two) times daily. (Patient not taking: Reported on 05/04/2015) 60 mL 0   No current facility-administered medications on file prior to visit.    ROS ROS otherwise unremarkable unless lsited above.   Physical Examination: BP 108/70 mmHg  Pulse 71  Temp(Src) 98.3 F (36.8 C) (Oral)  Resp 18  Ht 5' 2.5" (1.588 m)  Wt 143 lb (64.864 kg)  BMI 25.72 kg/m2  SpO2 99%  LMP 12/18/2005 Ideal Body Weight: Weight in (lb) to have BMI = 25: 138.6 Wt Readings from Last 3 Encounters:  05/04/15 143 lb (64.864 kg)  04/28/15 139 lb 9.6 oz (63.322 kg)  04/10/15 141 lb (63.957 kg)     Physical Exam  Constitutional: She is oriented to person, place, and time. She appears well-developed and well-nourished. No distress.  HENT:  Head: Normocephalic and atraumatic.  Right Ear: External ear normal.  Left Ear: External ear normal.  Eyes: Conjunctivae and EOM are normal. Pupils are equal, round, and reactive to light.  Cardiovascular: Normal rate.   Pulmonary/Chest: Effort normal. No respiratory distress. She has wheezes.  Neurological: She is alert and oriented to person, place, and time.  Skin: She is not diaphoretic.  Psychiatric: She has a normal mood and affect. Her behavior is normal.     Results for orders placed or performed in visit on 05/04/15  POCT CBC  Result Value Ref Range   WBC 7.0 4.6 - 10.2 K/uL   Lymph, poc 2.3 0.6 - 3.4   POC LYMPH PERCENT 32.4 10 - 50 %L   MID (cbc) 0.2 0 - 0.9   POC MID % 3.5 0 - 12 %M   POC Granulocyte 4.5 2 - 6.9   Granulocyte percent 64.1 37 - 80 %G   RBC 4.08 4.04 - 5.48 M/uL   Hemoglobin 12.8 12.2 - 16.2 g/dL   HCT, POC 36.8 (A) 37.7 - 47.9 %   MCV 90.4 80 - 97 fL   MCH, POC 31.3 (A) 27 - 31.2 pg   MCHC 34.6 31.8 - 35.4 g/dL   RDW, POC 16.7 %   Platelet Count, POC 247 142 - 424 K/uL   MPV 7.0 0 - 99.8 fL   [facility] reading (PRIMARY): Negative  Assessment and Plan: Rachael Fleming is a 54 y.o. female who is  here today for continued cough.  Given prednisone after cbc is normal.  Hopeful this will  open airways to help with breathing.  Advised to use albuterol   Acute bronchitis, viral - Plan: predniSONE (DELTASONE) 20 MG tablet, benzonatate (TESSALON) 100 MG capsule  Cough - Plan: POCT CBC, DG Chest 2 View, albuterol (PROVENTIL) (2.5 MG/3ML) 0.083% nebulizer solution 2.5 mg, ipratropium (ATROVENT) nebulizer solution 0.5 mg  Dyspnea - Plan: POCT CBC, DG Chest 2 View, albuterol (PROVENTIL HFA;VENTOLIN HFA) 108 (90 BASE) MCG/ACT inhaler, albuterol (PROVENTIL) (2.5 MG/3ML) 0.083% nebulizer solution 2.5 mg, ipratropium (ATROVENT) nebulizer solution 0.5 mg  Ivar Drape, PA-C [facility] 05/04/2015 11:02 AM

## 2015-05-04 NOTE — Patient Instructions (Signed)
Please take the steroid dose as prescribed.  If you develop purulent cough then you need to let us know.  Also if fever, further dizziness, nausea, and shortness of breath--you must come back in as well.   Use the inhaler as you are doing for the first 24 hours.

## 2015-05-14 ENCOUNTER — Encounter (HOSPITAL_COMMUNITY): Payer: Self-pay | Admitting: Emergency Medicine

## 2015-05-14 ENCOUNTER — Emergency Department (HOSPITAL_COMMUNITY)
Admission: EM | Admit: 2015-05-14 | Discharge: 2015-05-14 | Disposition: A | Discharge status: Home / Self Care | Payer: No Typology Code available for payment source | Attending: Emergency Medicine | Admitting: Emergency Medicine

## 2015-05-14 DIAGNOSIS — Z7982 Long term (current) use of aspirin: Secondary | ICD-10-CM | POA: Insufficient documentation

## 2015-05-14 DIAGNOSIS — I10 Essential (primary) hypertension: Secondary | ICD-10-CM | POA: Diagnosis not present

## 2015-05-14 DIAGNOSIS — M1990 Unspecified osteoarthritis, unspecified site: Secondary | ICD-10-CM | POA: Insufficient documentation

## 2015-05-14 DIAGNOSIS — Z880 Allergy status to penicillin: Secondary | ICD-10-CM | POA: Diagnosis not present

## 2015-05-14 DIAGNOSIS — F419 Anxiety disorder, unspecified: Secondary | ICD-10-CM | POA: Insufficient documentation

## 2015-05-14 DIAGNOSIS — R05 Cough: Secondary | ICD-10-CM | POA: Diagnosis present

## 2015-05-14 DIAGNOSIS — G43909 Migraine, unspecified, not intractable, without status migrainosus: Secondary | ICD-10-CM | POA: Insufficient documentation

## 2015-05-14 DIAGNOSIS — J40 Bronchitis, not specified as acute or chronic: Secondary | ICD-10-CM

## 2015-05-14 DIAGNOSIS — F329 Major depressive disorder, single episode, unspecified: Secondary | ICD-10-CM | POA: Diagnosis not present

## 2015-05-14 DIAGNOSIS — Z8719 Personal history of other diseases of the digestive system: Secondary | ICD-10-CM | POA: Insufficient documentation

## 2015-05-14 DIAGNOSIS — Z79899 Other long term (current) drug therapy: Secondary | ICD-10-CM | POA: Diagnosis not present

## 2015-05-14 MED ORDER — PREDNISONE 20 MG PO TABS
60.0000 mg | ORAL_TABLET | Freq: Once | ORAL | Status: AC
  Administered 2015-05-14: 60 mg via ORAL
  Filled 2015-05-14: qty 3

## 2015-05-14 MED ORDER — PREDNISONE 10 MG PO TABS
40.0000 mg | ORAL_TABLET | Freq: Every day | ORAL | Status: DC
Start: 2015-05-14 — End: 2015-09-12

## 2015-05-14 NOTE — ED Notes (Signed)
Awake. Verbally responsive. A/O x4. Resp even and unlabored. No audible adventitious breath sounds noted. ABC's intact.  

## 2015-05-14 NOTE — ED Notes (Addendum)
Awake. Verbally responsive. A/O x4. Resp even and unlabored. Occ nonproductive moist cough. ABC's intact. Pt denies fever and SHOB. Family at bedside.

## 2015-05-14 NOTE — ED Provider Notes (Signed)
CSN: [id number]     Arrival date & time 05/14/15  [id number] History   First MD Initiated Contact with Patient 05/14/15 0756     Chief Complaint  Patient presents with  . Bronchitis     (Consider location/radiation/quality/duration/timing/severity/associated sxs/prior Treatment) The history is provided by the patient.   patient with a two-week history of persistent cough. Consistent with an upper respiratory infection. Now has nasal congestion. Patient seen December 15 at urgent care with a negative chest x-ray. Patient was started on prednisone at that time is continuing to take albuterol and Mucinex DM. Finish the prednisone course approximately a week ago. Patient without any development of fevers or worse shortness of breath. Patient just doesn't understand why the cough is been persistent. Patient also was treated with a Z-Pak without any real change. Patient also felt that the prednisone didn't make any difference either.  Past Medical History  Diagnosis Date  . DEPRESSION     PTSD complication, working at [employer]  . GLAUCOMA   . HYPERTENSION   . GERD   . ARTHRITIS   . Migraine   . Anxiety   . Abnormal Pap smear 1984    Cryo  . Heart palpitations 03/2013  . Vertigo    Past Surgical History  Procedure Laterality Date  . Cesarean section  1990  . Bilateral breast reduction  1998  . Skin graft  2006  . Bladder tack  2008  . Abdominal hysterectomy  2008    TVH  . L3-5 decompression  06/2009  . Breast surgery    . Spine surgery    . Tubal ligation  1/90  . Cervix lesion destruction  1984  . Shoulder surgery Right    Family History  Problem Relation Age of Onset  . Arthritis Mother   . Diabetes Mother   . Dementia Mother   . Stroke Mother   . Hypertension Mother   . Alzheimer's disease Mother   . Arthritis Father   . Cancer Father     prostate, metastatic turned into bone  . Stroke Father   . Hypertension Father   . Alcohol abuse Sister     committed suicide   . Diabetes Maternal Grandmother   . Cancer Maternal Grandfather   . Cancer Paternal Grandmother   . Cancer Paternal Grandfather   . Coronary artery disease  18    Grandfather with MI   Social History  Substance Use Topics  . Smoking status: Never Smoker   . 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 Alcohol Use: 1.2 - 1.8 oz/week    2-3 Standard drinks or equivalent per week     Comment: Social   OB History    Gravida Para Term Preterm AB TAB SAB Ectopic Multiple Living   4 2 2  2     2      Review of Systems  Constitutional: Negative for fever.  HENT: Positive for congestion.   Eyes: Negative for redness.  Respiratory: Positive for cough and shortness of breath. Negative for wheezing.   Cardiovascular: Negative for chest pain.  Gastrointestinal: Negative for abdominal pain.  Genitourinary: Negative for dysuria.  Musculoskeletal: Negative for back pain.  Skin: Negative for rash.  Neurological: Negative for headaches.  Hematological: Does not bruise/bleed easily.  Psychiatric/Behavioral: Negative for confusion.      Allergies  Penicillins  Home Medications   Prior to Admission medications  Medication Sig Start Date End Date Taking? Authorizing Provider  albuterol (PROVENTIL HFA;VENTOLIN HFA) 108 (90 BASE) MCG/ACT inhaler Inhale 2 puffs into the lungs every 4 (four) hours as needed for wheezing or shortness of breath (cough, shortness of breath or wheezing.). 05/04/15   Dorian Heckle English, PA  aspirin EC 81 MG tablet Take 81 mg by mouth daily.    Historical Provider, MD  azithromycin (ZITHROMAX) 250 MG tablet Take 2 tabs PO x 1 dose, then 1 tab PO QD x 4 days Patient not taking: Reported on 05/04/2015 04/28/15   Roselee Culver, MD  benzonatate (TESSALON) 100 MG capsule Take 1-2 capsules (100-200 mg total) by mouth 3 (three) times daily as needed for cough. 05/04/15   Dorian Heckle English, PA   chlorpheniramine-HYDROcodone (TUSSIONEX PENNKINETIC ER) 10-8 MG/5ML SUER Take 5 mLs by mouth 2 (two) times daily. Patient not taking: Reported on 05/04/2015 04/28/15   Roselee Culver, MD  Eszopiclone 3 MG TABS TAKE 1 TABLET BY MOUTH EVERY NIGHT AT BEDTIME TAKE IMMEDIATELY BEFORE BEDTIME 03/16/15   Hoyt Koch, MD  gabapentin (NEURONTIN) 400 MG capsule Take 1 capsule (400 mg total) by mouth 3 (three) times daily. 11/24/14   Hoyt Koch, MD  loperamide (IMODIUM A-D) 2 MG tablet 2 now and one hourly prn diarrhea.  Max 8 tabs in 24 hours 04/10/15   Roselee Culver, MD  Multiple Vitamins-Minerals (MULTIVITAMIN & MINERAL PO) Take by mouth daily. Alive 50+ MVI.    Historical Provider, MD  ondansetron (ZOFRAN-ODT) 8 MG disintegrating tablet Take 1 tablet (8 mg total) by mouth every 8 (eight) hours as needed for nausea. 02/22/15   Gerre Pebbles, MD  PARoxetine (PAXIL) 20 MG tablet Take 1 tablet (20 mg total) by mouth daily. 03/16/15   Hoyt Koch, MD  predniSONE (DELTASONE) 20 MG tablet Take 3 PO QAM x3days, 2 PO QAM x3days, 1 PO QAM x3days 05/04/15   Dorian Heckle English, PA  traZODone (DESYREL) 100 MG tablet Take 1 tablet (100 mg total) by mouth at bedtime. 100-200mg  12/12/14   Hoyt Koch, MD   BP 117/76 mmHg  Pulse 84  Temp(Src) 98.2 F (36.8 C) (Oral)  Resp 18  SpO2 98%  LMP 12/18/2005 Physical Exam  Constitutional: She is oriented to person, place, and time. She appears well-developed and well-nourished. No distress.  HENT:  Head: Normocephalic and atraumatic.  Mouth/Throat: Oropharynx is clear and moist.  Eyes: Conjunctivae and EOM are normal. Pupils are equal, round, and reactive to light.  Neck: Normal range of motion. Neck supple.  Cardiovascular: Normal rate, regular rhythm and normal heart sounds.   No murmur heard. Pulmonary/Chest: Effort normal and breath sounds normal. No respiratory distress. She has no wheezes. She has no rales.  Abdominal:  Soft. Bowel sounds are normal. There is no tenderness.  Musculoskeletal: Normal range of motion.  Neurological: She is alert and oriented to person, place, and time. No cranial nerve deficit. She exhibits normal muscle tone. Coordination normal.  Skin: Skin is warm. No rash noted.  Nursing note and vitals reviewed.   ED Course  Procedures (including critical care time) Labs Review Labs Reviewed - No data to display  Imaging Review No results found. I have personally reviewed and evaluated these images and lab results as part of my medical decision-making.   EKG Interpretation None      MDM   Final diagnoses:  Bronchitis     Patient with 2 week history of upper respiratory  infection and persistent cough. No fevers no hypoxia here. Patient nontoxic no acute distress. Patient had x-ray done of the chest on December 15 without any acute findings reviewed by me. Patient is already using albuterol and Mucinex DM. Patient finished up a course of prednisone proximally a week ago. Patient's exam here lungs are clear bilaterally. Will restart on another course of prednisone. Patient's already had a course of antibiotics with a Z-Pak. Patient will continue to use albuterol and Mucinex DM. First dose of prednisone given here.  Fredia Sorrow, MD 05/14/15 [phone]

## 2015-05-14 NOTE — Discharge Instructions (Signed)
Take the prednisone as directed. Reviewed your x-ray from December 15 the no evidence of any acute lung process. No evidence of pneumonia. Return for developments of fever or new or worse symptoms. Continue usual albuterol inhaler. Continue to use the Mucinex DM. Make an appointment with your regular doctor as needed. Return here for any new or worse symptoms.

## 2015-05-14 NOTE — ED Notes (Signed)
Per pt, states cough for 2 weeks-diagnosed with Bronchitis-antibiotics, steroids not working

## 2015-05-30 ENCOUNTER — Ambulatory Visit
Admission: RE | Admit: 2015-05-30 | Discharge: 2015-05-30 | Disposition: A | Discharge status: Home / Self Care | Payer: PRIVATE HEALTH INSURANCE | Source: Ambulatory Visit

## 2015-05-30 DIAGNOSIS — Z1231 Encounter for screening mammogram for malignant neoplasm of breast: Secondary | ICD-10-CM

## 2015-06-29 ENCOUNTER — Telehealth: Payer: Self-pay | Admitting: Emergency Medicine

## 2015-06-29 NOTE — Telephone Encounter (Signed)
Out of hold per Dr. Miller.   

## 2015-06-29 NOTE — Telephone Encounter (Signed)
-----   Message from Megan Salon, MD sent at 06/29/2015 12:32 PM EST ----- Regarding: RE: Mammogram hold  Yes out of recall.  Thanks.  MSM ----- Message -----    From: Michele Mcalpine, RN    Sent: 06/29/2015   7:46 AM      To: Megan Salon, MD Subject: Mammogram hold                                 Dr. Sabra Heck, This is Melvia Heaps CNM patient. In hold until scheduled screening mammogram, needed a scholarship. She has completed mammogram. Okay to remove from hold?

## 2015-07-11 ENCOUNTER — Other Ambulatory Visit: Payer: Self-pay | Admitting: Internal Medicine

## 2015-08-21 ENCOUNTER — Other Ambulatory Visit: Payer: Self-pay | Admitting: Internal Medicine

## 2015-09-04 ENCOUNTER — Other Ambulatory Visit: Payer: Self-pay | Admitting: Internal Medicine

## 2015-09-04 NOTE — Telephone Encounter (Signed)
Sent to pharmacy 

## 2015-09-12 ENCOUNTER — Ambulatory Visit (INDEPENDENT_AMBULATORY_CARE_PROVIDER_SITE_OTHER): Payer: No Typology Code available for payment source | Admitting: Certified Nurse Midwife

## 2015-09-12 ENCOUNTER — Encounter: Payer: Self-pay | Admitting: Certified Nurse Midwife

## 2015-09-12 VITALS — BP 118/72 | HR 70 | Resp 16 | Ht 61.5 in | Wt 135.0 lb

## 2015-09-12 DIAGNOSIS — L298 Other pruritus: Secondary | ICD-10-CM | POA: Diagnosis not present

## 2015-09-12 DIAGNOSIS — Z0000 Encounter for general adult medical examination without abnormal findings: Secondary | ICD-10-CM | POA: Diagnosis not present

## 2015-09-12 DIAGNOSIS — N898 Other specified noninflammatory disorders of vagina: Secondary | ICD-10-CM

## 2015-09-12 DIAGNOSIS — Z01419 Encounter for gynecological examination (general) (routine) without abnormal findings: Secondary | ICD-10-CM

## 2015-09-12 LAB — POCT URINALYSIS DIPSTICK
Bilirubin, UA: NEGATIVE
GLUCOSE UA: NEGATIVE
KETONES UA: NEGATIVE
Leukocytes, UA: NEGATIVE
Nitrite, UA: NEGATIVE
PROTEIN UA: NEGATIVE
RBC UA: NEGATIVE
UROBILINOGEN UA: NEGATIVE
pH, UA: 5

## 2015-09-12 NOTE — Patient Instructions (Signed)

## 2015-09-12 NOTE — Progress Notes (Signed)
55 y.o. [id number] Married  African American Fe here for annual exam.  Sees Dr. Sharlet Salina PCP yearly for aex/labs, and medication management for Depression. Medications stable. Having some vaginal itching, but no increase in discharge. Has treated with OTC Monistat with no change. No new personal products. Does stay in work out clothes for a long time. Denies vaginal odor also.Took cruise after last job in 6/16!Marland Kitchen Now teaching aerobics and yoga. Has lost 28 pounds, while teaching! No health issues today.  Patient's last menstrual period was 12/18/2005.          Sexually active: Yes.    The current method of family planning is status post hysterectomy.    Exercising: Yes.    yoga, martial arts, aerobics, weights Smoker:  no  Health Maintenance: Pap:  1/10 neg MMG:  05-30-15 category b density,birads 1:neg Colonoscopy:  2010 f/u 43yrs BMD:   none TDaP:  2015 Shingles: no Pneumonia: no Hep C and HIV: HIV neg yrs ago Labs: poct urine-neg Self breast exam: done monthly   reports that she has never smoked. She has never used smokeless tobacco. She reports that she drinks about 1.8 oz of alcohol per week. She reports that she does not use illicit drugs.  Past Medical History  Diagnosis Date  . DEPRESSION     PTSD complication, working at [employer]  . GLAUCOMA   . HYPERTENSION   . GERD   . ARTHRITIS   . Migraine   . Anxiety   . Abnormal Pap smear 1984    Cryo  . Heart palpitations 03/2013  . Vertigo     Past Surgical History  Procedure Laterality Date  . Cesarean section  1990  . Bilateral breast reduction  1998  . Skin graft  2006  . Bladder tack  2008  . Abdominal hysterectomy  2008    TVH  . L3-5 decompression  06/2009  . Breast surgery    . Spine surgery    . Tubal ligation  1/90  . Cervix lesion destruction  1984  . Shoulder surgery Right     Current Outpatient Prescriptions  Medication Sig Dispense Refill  . aspirin EC 81 MG tablet Take 81 mg by mouth daily.    .  Eszopiclone 3 MG TABS TAKE 1 TABLET BY MOUTH EVERY NIGHT AT BEDTIME. TAKE IMMEDIATELY BEFORE BEDTIME. 30 tablet 2  . gabapentin (NEURONTIN) 400 MG capsule Take 1 capsule (400 mg total) by mouth 3 (three) times daily. (Patient taking differently: Take 400 mg by mouth 2 (two) times daily. ) 90 capsule 6  . Multiple Vitamins-Minerals (MULTIVITAMIN & MINERAL PO) Take by mouth daily. Alive 50+ MVI.    Marland Kitchen PARoxetine (PAXIL) 20 MG tablet Take 1 tablet (20 mg total) by mouth daily. 30 tablet 6  . traZODone (DESYREL) 100 MG tablet TAKE 1 TABLET BY MOUTH EVERY NIGHT AT BEDTIME 30 tablet 0   No current facility-administered medications for this visit.    Family History  Problem Relation Age of Onset  . Arthritis Mother   . Diabetes Mother   . Dementia Mother   . Stroke Mother   . Hypertension Mother   . Alzheimer's disease Mother   . Arthritis Father   . Cancer Father     prostate, metastatic turned into bone  . Stroke Father   . Hypertension Father   . Alcohol abuse Sister     committed suicide  . Diabetes Maternal Grandmother   . Cancer Maternal Grandfather   .  Cancer Paternal Grandmother   . Cancer Paternal Grandfather   . Coronary artery disease  47    Grandfather with MI    ROS:  Pertinent items are noted in HPI.  Otherwise, a comprehensive ROS was negative.  Exam:   BP 118/72 mmHg  Pulse 70  Resp 16  Ht 5' 1.5" (1.562 m)  Wt 135 lb (61.236 kg)  BMI 25.10 kg/m2  LMP 12/18/2005 Height: 5' 1.5" (156.2 cm) Ht Readings from Last 3 Encounters:  09/12/15 5' 1.5" (1.562 m)  05/04/15 5' 2.5" (1.588 m)  04/28/15 5' 2.5" (1.588 m)    General appearance: alert, cooperative and appears stated age Head: Normocephalic, without obvious abnormality, atraumatic Neck: no adenopathy, supple, symmetrical, trachea midline and thyroid normal to inspection and palpation Lungs: clear to auscultation bilaterally Breasts: normal appearance, no masses or tenderness, No nipple retraction or  dimpling, No nipple discharge or bleeding, No axillary or supraclavicular adenopathy Heart: regular rate and rhythm Abdomen: soft, non-tender; no masses,  no organomegaly Extremities: extremities normal, atraumatic, no cyanosis or edema Skin: Skin color, texture, turgor normal. No rashes or lesions Lymph nodes: Cervical, supraclavicular, and axillary nodes normal. No abnormal inguinal nodes palpated Neurologic: Grossly normal   Pelvic: External genitalia:  no lesions, no redness or scaling, Normal female              Urethra:  normal appearing urethra with no masses, tenderness or lesions              Bartholin's and Skene's: normal                 Vagina: normal appearing vagina with normal color and white thick discharge, no lesions,no redness affirm taken              Cervix: absent              Pap taken: No. Bimanual Exam:  Uterus:  uterus absent              Adnexa: normal adnexa and no mass, fullness, tenderness               Rectovaginal: Confirms               Anus:  normal sphincter tone, no lesions  Chaperone present: yes  A:  Well Woman with normal exam  Menopausal no HRT, TVH for bleeding, ovaries retained  R/O vaginal infection  Depression/anxiety with PCP managemnt  P:   Reviewed health and wellness pertinent to exam  Discussed baking soda or aveeno sitz bath for vaginal itching, will treat per affirm. Avoid long periods of time in work out clothes can increase risk of vaginal infection. Take change of underwear to keep moisture away. Questions addressed.  Lab: Hep C  Pap smear as above not taken  Continue with PCP as indicated   counseled on breast self exam, mammography screening, adequate intake of calcium and vitamin D, diet and exercise  return annually or prn  An After Visit Summary was printed and given to the patient.

## 2015-09-13 LAB — WET PREP BY MOLECULAR PROBE
Candida species: NEGATIVE
GARDNERELLA VAGINALIS: NEGATIVE
Trichomonas vaginosis: NEGATIVE

## 2015-09-13 LAB — HEPATITIS C ANTIBODY: HCV AB: NEGATIVE

## 2015-09-14 NOTE — Progress Notes (Signed)
Encounter reviewed Sophira Rumler, MD   

## 2015-09-18 ENCOUNTER — Other Ambulatory Visit: Payer: Self-pay | Admitting: Internal Medicine

## 2015-10-27 ENCOUNTER — Ambulatory Visit (INDEPENDENT_AMBULATORY_CARE_PROVIDER_SITE_OTHER): Payer: No Typology Code available for payment source | Admitting: Family Medicine

## 2015-10-27 VITALS — BP 130/84 | HR 64 | Temp 98.2°F | Resp 16 | Ht 61.5 in | Wt 134.0 lb

## 2015-10-27 DIAGNOSIS — G43011 Migraine without aura, intractable, with status migrainosus: Secondary | ICD-10-CM

## 2015-10-27 MED ORDER — KETOROLAC TROMETHAMINE 60 MG/2ML IM SOLN
60.0000 mg | Freq: Once | INTRAMUSCULAR | Status: AC
  Administered 2015-10-27: 60 mg via INTRAMUSCULAR

## 2015-10-27 MED ORDER — METHYLPREDNISOLONE ACETATE 80 MG/ML IJ SUSP
80.0000 mg | Freq: Once | INTRAMUSCULAR | Status: AC
  Administered 2015-10-27: 80 mg via INTRAMUSCULAR

## 2015-10-27 MED ORDER — DIPHENHYDRAMINE HCL 50 MG/ML IJ SOLN
25.0000 mg | Freq: Once | INTRAMUSCULAR | Status: AC
  Administered 2015-10-27: 25 mg via INTRAMUSCULAR

## 2015-10-27 MED ORDER — PROMETHAZINE HCL 25 MG/ML IJ SOLN
25.0000 mg | Freq: Once | INTRAMUSCULAR | Status: AC
  Administered 2015-10-27: 25 mg via INTRAMUSCULAR

## 2015-10-27 NOTE — Patient Instructions (Addendum)
Thank you for coming in today. Go to the emergency room if your headache becomes excruciating or you have weakness or numbness or uncontrolled vomiting.  Return as needed.   Migraine Headache A migraine headache is an intense, throbbing pain on one or both sides of your head. A migraine can last for 30 minutes to several hours. CAUSES  The exact cause of a migraine headache is not always known. However, a migraine may be caused when nerves in the brain become irritated and release chemicals that cause inflammation. This causes pain. Certain things may also trigger migraines, such as:  Alcohol.  Smoking.  Stress.  Menstruation.  Aged cheeses.  Foods or drinks that contain nitrates, glutamate, aspartame, or tyramine.  Lack of sleep.  Chocolate.  Caffeine.  Hunger.  Physical exertion.  Fatigue.  Medicines used to treat chest pain (nitroglycerine), birth control pills, estrogen, and some blood pressure medicines. SIGNS AND SYMPTOMS  Pain on one or both sides of your head.  Pulsating or throbbing pain.  Severe pain that prevents daily activities.  Pain that is aggravated by any physical activity.  Nausea, vomiting, or both.  Dizziness.  Pain with exposure to bright lights, loud noises, or activity.  General sensitivity to bright lights, loud noises, or smells. Before you get a migraine, you may get warning signs that a migraine is coming (aura). An aura may include:  Seeing flashing lights.  Seeing bright spots, halos, or zigzag lines.  Having tunnel vision or blurred vision.  Having feelings of numbness or tingling.  Having trouble talking.  Having muscle weakness. DIAGNOSIS  A migraine headache is often diagnosed based on:  Symptoms.  Physical exam.  A CT scan or MRI of your head. These imaging tests cannot diagnose migraines, but they can help rule out other causes of headaches. TREATMENT Medicines may be given for pain and nausea. Medicines can  also be given to help prevent recurrent migraines.  HOME CARE INSTRUCTIONS  Only take over-the-counter or prescription medicines for pain or discomfort as directed by your health care provider. The use of long-term narcotics is not recommended.  Lie down in a dark, quiet room when you have a migraine.  Keep a journal to find out what may trigger your migraine headaches. For example, write down:  What you eat and drink.  How much sleep you get.  Any change to your diet or medicines.  Limit alcohol consumption.  Quit smoking if you smoke.  Get 7-9 hours of sleep, or as recommended by your health care provider.  Limit stress.  Keep lights dim if bright lights bother you and make your migraines worse. SEEK IMMEDIATE MEDICAL CARE IF:   Your migraine becomes severe.  You have a fever.  You have a stiff neck.  You have vision loss.  You have muscular weakness or loss of muscle control.  You start losing your balance or have trouble walking.  You feel faint or pass out.  You have severe symptoms that are different from your first symptoms. MAKE SURE YOU:   Understand these instructions.  Will watch your condition.  Will get help right away if you are not doing well or get worse.   This information is not intended to replace advice given to you by your health care provider. Make sure you discuss any questions you have with your health care provider.   Document Released: 05/06/2005 Document Revised: 05/27/2014 Document Reviewed: 01/11/2013 Elsevier Interactive Patient Education [employer].  IF you received an x-ray today, you will receive an invoice from [facility]. Please contact [facility] at [phone] with questions or concerns regarding your invoice.   IF you received labwork today, you will receive an invoice from [employer]. Please contact [facility] at [phone] with questions or concerns regarding  your invoice.   Our billing staff will not be able to assist you with questions regarding bills from these companies.  You will be contacted with the lab results as soon as they are available. The fastest way to get your results is to activate your My Chart account. Instructions are located on the last page of this paperwork. If you have not heard from [place] regarding the results in 2 weeks, please contact this office.

## 2015-10-27 NOTE — Progress Notes (Signed)
Rachael Fleming is a 55 y.o. female who presents to [facility] today for migraine. Patient has a severe migraine headache starting this morning. She notes photophobia and nausea and a pounding bitemporal headache. Her symptoms are consistent with previous episodes of migraines. She denies any weakness or numbness or loss of function. No fevers or chills or head injury. No visual disturbance.   Past Medical History  Diagnosis Date  . DEPRESSION     PTSD complication, working at [employer]  . GLAUCOMA   . HYPERTENSION   . GERD   . ARTHRITIS   . Migraine   . Anxiety   . Abnormal Pap smear 1984    [facility]o  . Heart palpitations 03/2013  . Vertigo    Past Surgical History  Procedure Laterality Date  . Cesarean section  1990  . Bilateral breast reduction  1998  . Skin graft  2006  . Bladder tack  2008  . Abdominal hysterectomy  2008    [facility]  . L3-5 decompression  06/2009  . Breast surgery    . Spine surgery    . Tubal ligation  1/90  . Cervix lesion destruction  1984  . Shoulder surgery Right    Social History  Substance Use Topics  . Smoking status: Never Smoker   . 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 employed @ [employer] 3rd shift  . Alcohol Use: 1.8 oz/week    3 Standard drinks or equivalent per week     Comment: Social   ROS as above Medications: Current Outpatient Prescriptions  Medication Sig Dispense Refill  . aspirin EC 81 MG tablet Take 81 mg by mouth daily.    . Eszopiclone 3 MG TABS TAKE 1 TABLET BY MOUTH EVERY NIGHT AT BEDTIME. TAKE IMMEDIATELY BEFORE BEDTIME. 30 tablet 2  . gabapentin (NEURONTIN) 400 MG capsule Take 1 capsule (400 mg total) by mouth 3 (three) times daily. (Patient taking differently: Take 400 mg by mouth 2 (two) times daily. ) 90 capsule 6  . Multiple Vitamins-Minerals (MULTIVITAMIN & MINERAL PO) Take by mouth daily. Alive 50+ MVI.    Marland Kitchen PARoxetine (PAXIL) 20 MG tablet Take 1 tablet (20 mg total) by mouth  daily. 30 tablet 6  . traZODone (DESYREL) 100 MG tablet TAKE 1 TABLET BY MOUTH EVERY NIGHT AT BEDTIME 30 tablet 2   Current Facility-Administered Medications  Medication Dose Route Frequency Provider Last Rate Last Dose  . diphenhydrAMINE (BENADRYL) injection 25 mg  25 mg Intramuscular Once Gregor Hams, MD      . ketorolac (TORADOL) injection 60 mg  60 mg Intramuscular Once Gregor Hams, MD      . methylPREDNISolone acetate (DEPO-MEDROL) injection 80 mg  80 mg Intramuscular Once Gregor Hams, MD      . promethazine (PHENERGAN) injection 25 mg  25 mg Intramuscular Once Gregor Hams, MD       Allergies  Allergen Reactions  . Penicillins     REACTION: Nausea \\T \ vomitting     Exam:  BP 130/84 mmHg  Pulse 64  Temp(Src) 98.2 F (36.8 C) (Oral)  Resp 16  Ht 5' 1.5" (1.562 m)  Wt 134 lb (60.782 kg)  BMI 24.91 kg/m2  SpO2 97%  LMP 12/18/2005 Gen: Well NAD HEENT: EOMI,  MMM PERRL Lungs: Normal work of breathing. CTABL Heart: RRR no MRG Abd: NABS, Soft. Nondistended, Nontender Exts: Brisk capillary refill, warm and well perfused.  Psych: Alert  and oriented normal speech thought process and affect. Neuro: Normal coordination balance reflexes strength and sensation. Normal Gait  Patient was given a headache cocktail containing 60 mg of Toradol, 80 mg of Depo-Medrol, 25 mg of Phenergan, and 25 mg of Benadryl intramuscularly, she felt better.    No results found for this or any previous visit (from the past 24 hour(s)). No results found.  Assessment and Plan: 55 y.o. female with Migraine headache. Neurological exam is normal. Plan for headache cocktail as above. Watchful waiting at home and follow-up with primary care provider soon.  Discussed warning signs or symptoms. Please see discharge instructions. Patient expresses understanding.

## 2015-11-23 ENCOUNTER — Ambulatory Visit (INDEPENDENT_AMBULATORY_CARE_PROVIDER_SITE_OTHER): Payer: PRIVATE HEALTH INSURANCE | Admitting: Family

## 2015-11-23 ENCOUNTER — Encounter: Payer: Self-pay | Admitting: Family

## 2015-11-23 VITALS — BP 120/70 | HR 68 | Temp 98.1°F | Ht 61.5 in | Wt 133.8 lb

## 2015-11-23 DIAGNOSIS — B029 Zoster without complications: Secondary | ICD-10-CM

## 2015-11-23 MED ORDER — VALACYCLOVIR HCL 1 G PO TABS: 1000.0000 mg | ORAL_TABLET | Freq: Three times a day (TID) | ORAL | Status: DC

## 2015-11-23 NOTE — Progress Notes (Signed)
Pre visit review using our clinic review tool, if applicable. No additional management support is needed unless otherwise documented below in the visit note. 

## 2015-11-23 NOTE — Patient Instructions (Signed)
Let us know if pain becomes unbearable.   If there is no improvement in your symptoms, or if there is any worsening of symptoms, or if you have any additional concerns, please return for re-evaluation; or, if we are closed, consider going to the Emergency Room for evaluation if symptoms urgent.  Shingles Shingles, which is also known as herpes zoster, is an infection that causes a painful skin rash and fluid-filled blisters. Shingles is not related to genital herpes, which is a sexually transmitted infection.   Shingles only develops in people who:  Have had chickenpox.  Have received the chickenpox vaccine. (This is rare.) CAUSES Shingles is caused by varicella-zoster virus (VZV). This is the same virus that causes chickenpox. After exposure to VZV, the virus stays in the body in an inactive (dormant) state. Shingles develops if the virus reactivates. This can happen many years after the initial exposure to VZV. It is not known what causes this virus to reactivate. RISK FACTORS People who have had chickenpox or received the chickenpox vaccine are at risk for shingles. Infection is more common in people who:  Are older than age 64.  Have a weakened defense (immune) system, such as those with HIV, AIDS, or cancer.  Are taking medicines that weaken the immune system, such as transplant medicines.  Are under great stress. SYMPTOMS Early symptoms of this condition include itching, tingling, and pain in an area on your skin. Pain may be described as burning, stabbing, or throbbing. A few days or weeks after symptoms start, a painful red rash appears, usually on one side of the body in a bandlike or beltlike pattern. The rash eventually turns into fluid-filled blisters that break open, scab over, and dry up in about 2-3 weeks. At any time during the infection, you may also develop:  A fever.  Chills.  A headache.  An upset stomach. DIAGNOSIS This condition is diagnosed with a skin  exam. Sometimes, skin or fluid samples are taken from the blisters before a diagnosis is made. These samples are examined under a microscope or sent to a lab for testing. TREATMENT There is no specific cure for this condition. Your health care provider will probably prescribe medicines to help you manage pain, recover more quickly, and avoid long-term problems. Medicines may include:  Antiviral drugs.  Anti-inflammatory drugs.  Pain medicines. If the area involved is on your face, you may be referred to a specialist, such as an eye doctor (ophthalmologist) or an ear, nose, and throat (ENT) doctor to help you avoid eye problems, chronic pain, or disability. HOME CARE INSTRUCTIONS Medicines  Take medicines only as directed by your health care provider.  Apply an anti-itch or numbing cream to the affected area as directed by your health care provider. Blister and Rash Care  Take a cool bath or apply cool compresses to the area of the rash or blisters as directed by your health care provider. This may help with pain and itching.  Keep your rash covered with a loose bandage (dressing). Wear loose-fitting clothing to help ease the pain of material rubbing against the rash.  Keep your rash and blisters clean with mild soap and cool water or as directed by your health care provider.  Check your rash every day for signs of infection. These include redness, swelling, and pain that lasts or increases.  Do not pick your blisters.  Do not scratch your rash. General Instructions  Rest as directed by your health care provider.  Keep all  follow-up visits as directed by your health care provider. This is important.  Until your blisters scab over, your infection can cause chickenpox in people who have never had it or been vaccinated against it. To prevent this from happening, avoid contact with other people, especially:  Babies.  Pregnant women.  Children who have eczema.  Elderly people who  have transplants.  People who have chronic illnesses, such as leukemia or AIDS. SEEK MEDICAL CARE IF:  Your pain is not relieved with prescribed medicines.  Your pain does not get better after the rash heals.  Your rash looks infected. Signs of infection include redness, swelling, and pain that lasts or increases. SEEK IMMEDIATE MEDICAL CARE IF:  The rash is on your face or nose.  You have facial pain, pain around your eye area, or loss of feeling on one side of your face.  You have ear pain or you have ringing in your ear.  You have loss of taste.  Your condition gets worse.   This information is not intended to replace advice given to you by your health care provider. Make sure you discuss any questions you have with your health care provider.   Document Released: 05/06/2005 Document Revised: 05/27/2014 Document Reviewed: 03/17/2014 Elsevier Interactive Patient Education [employer].

## 2015-11-23 NOTE — Progress Notes (Signed)
Subjective:    Patient ID: Rachael Fleming, female    DOB: 1960-06-08, 55 y.o.   MRN: [id number]   Rachael Fleming is a 55 y.o. female who presents today for an acute visit.    HPI Comments: Patient here for evaluation of rash which first appeared on left groin 5 days ago. She describes it is very painful. Blisters. Had chickenpox as a child.  Past Medical History  Diagnosis Date  . DEPRESSION     PTSD complication, working at [employer]  . GLAUCOMA   . HYPERTENSION   . GERD   . ARTHRITIS   . Migraine   . Anxiety   . Abnormal Pap smear 1984    Cryo  . Heart palpitations 03/2013  . Vertigo    Allergies: Penicillins Current Outpatient Prescriptions on File Prior to Visit  Medication Sig Dispense Refill  . aspirin EC 81 MG tablet Take 81 mg by mouth daily.    . Eszopiclone 3 MG TABS TAKE 1 TABLET BY MOUTH EVERY NIGHT AT BEDTIME. TAKE IMMEDIATELY BEFORE BEDTIME. 30 tablet 2  . gabapentin (NEURONTIN) 400 MG capsule Take 1 capsule (400 mg total) by mouth 3 (three) times daily. (Patient taking differently: Take 400 mg by mouth daily. ) 90 capsule 6  . Multiple Vitamins-Minerals (MULTIVITAMIN & MINERAL PO) Take by mouth daily. Alive 50+ MVI.    Marland Kitchen PARoxetine (PAXIL) 20 MG tablet Take 1 tablet (20 mg total) by mouth daily. 30 tablet 6  . traZODone (DESYREL) 100 MG tablet TAKE 1 TABLET BY MOUTH EVERY NIGHT AT BEDTIME 30 tablet 2   No current facility-administered medications on file prior to visit.    Social History  Substance Use Topics  . Smoking status: Never Smoker   . 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 employed @ [employer] 3rd shift  . Alcohol Use: 1.8 oz/week    3 Standard drinks or equivalent per week     Comment: Social    Review of Systems  Constitutional: Negative for fever and chills.  Respiratory: Negative for cough.   Cardiovascular: Negative for chest pain and palpitations.  Gastrointestinal: Negative for nausea and  vomiting.  Skin: Positive for rash.      Objective:    BP 120/70 mmHg  Pulse 68  Temp(Src) 98.1 F (36.7 C) (Oral)  Ht 5' 1.5" (1.562 m)  Wt 133 lb 12 oz (60.669 kg)  BMI 24.87 kg/m2  SpO2 97%  LMP 12/18/2005   Physical Exam  Constitutional: She appears well-developed and well-nourished.  Eyes: Conjunctivae are normal.  Cardiovascular: Normal rate, regular rhythm, normal heart sounds and normal pulses.   Pulmonary/Chest: Effort normal and breath sounds normal. She has no wheezes. She has no rhonchi. She has no rales.  Neurological: She is alert.  Skin: Skin is warm and dry. Rash noted. Rash is vesicular.     Psychiatric: She has a normal mood and affect. Her speech is normal and behavior is normal. Thought content normal.  Vitals reviewed.      Assessment & Plan:   1. Herpes zoster  - valACYclovir (VALTREX) 1000 MG tablet; Take 1 tablet (1,000 mg total) by mouth 3 (three) times daily.  Dispense: 21 tablet; Refill: 0    I am having Ms. Skupien maintain her Multiple Vitamins-Minerals (MULTIVITAMIN & MINERAL PO), aspirin EC, gabapentin, PARoxetine, Eszopiclone, traZODone, and tiZANidine.   Meds ordered this encounter  Medications  . tiZANidine (ZANAFLEX) 4 MG tablet  Sig: TK 1/2 TO 1 T PO TID PRF SPASM    Refill:  1     Start medications as prescribed and explained to patient on After Visit Summary ( AVS). Risks, benefits, and alternatives of the medications and treatment plan prescribed today were discussed, and patient expressed understanding.   Education regarding symptom management and diagnosis given to patient.   Follow-up:Plan follow-up and return precautions given if any worsening symptoms or change in condition.   Continue to follow with Hoyt Koch, MD for routine health maintenance.   Suellyn Tora Kindred and I agreed with plan.   Mable Paris, FNP

## 2015-11-26 ENCOUNTER — Other Ambulatory Visit: Payer: Self-pay | Admitting: Internal Medicine

## 2015-11-27 NOTE — Telephone Encounter (Signed)
Sent to pharmacy 

## 2015-12-01 ENCOUNTER — Ambulatory Visit (INDEPENDENT_AMBULATORY_CARE_PROVIDER_SITE_OTHER): Payer: PRIVATE HEALTH INSURANCE | Admitting: Family Medicine

## 2015-12-01 ENCOUNTER — Encounter: Payer: Self-pay | Admitting: Family Medicine

## 2015-12-01 ENCOUNTER — Telehealth: Payer: Self-pay | Admitting: Internal Medicine

## 2015-12-01 VITALS — BP 100/60 | HR 77 | Temp 98.2°F | Ht 61.5 in | Wt 131.6 lb

## 2015-12-01 DIAGNOSIS — B029 Zoster without complications: Secondary | ICD-10-CM

## 2015-12-01 MED ORDER — TRAMADOL HCL 50 MG PO TABS: 50.0000 mg | ORAL_TABLET | Freq: Two times a day (BID) | ORAL | Status: DC | PRN

## 2015-12-01 MED ORDER — TRAMADOL HCL 50 MG PO TABS: 50.0000 mg | ORAL_TABLET | Freq: Three times a day (TID) | ORAL | Status: DC | PRN

## 2015-12-01 NOTE — Progress Notes (Signed)
HPI:  Rachael Fleming this started about 2 weeks ago and she started an antiviral last week. The rash is mostly drying up, however she does still have pain along the area of the rash in the lower left back and wrapping around to the front. The pain is prickly in nature. She wondered if Aleve or Advil would be helpful with this? She did try some Advil she had at home and it did not help a lot. Denies fevers, vomiting, headache, rash elsewhere. Reports she has taken tramadol in the past and tolerated this well.  ROS: See pertinent positives and negatives per HPI.  Past Medical History  Diagnosis Date  . DEPRESSION     PTSD complication, working at [employer]  . GLAUCOMA   . HYPERTENSION   . GERD   . ARTHRITIS   . Migraine   . Anxiety   . Abnormal Pap smear 1984    Cryo  . Heart palpitations 03/2013  . Vertigo     Past Surgical History  Procedure Laterality Date  . Cesarean section  1990  . Bilateral breast reduction  1998  . Skin graft  2006  . Bladder tack  2008  . Abdominal hysterectomy  2008    [facility]  . L3-5 decompression  06/2009  . Breast surgery    . Spine surgery    . Tubal ligation  1/90  . Cervix lesion destruction  1984  . Shoulder surgery Right     Family History  Problem Relation Age of Onset  . Arthritis Mother   . Diabetes Mother   . Dementia Mother   . Stroke Mother   . Hypertension Mother   . Alzheimer's disease Mother   . Arthritis Father   . Cancer Father     prostate, metastatic turned into bone  . Stroke Father   . Hypertension Father   . Alcohol abuse Sister     committed suicide  . Diabetes Maternal Grandmother   . Cancer Maternal Grandfather   . Cancer Paternal Grandmother   . Cancer Paternal Grandfather   . Coronary artery disease  85    Grandfather with MI    Social History   Social History  . Marital Status: Married    Spouse Name: N/A  . Number of Children: N/A  . Years of Education: N/A   Social History Main Topics    . Smoking status: Never Smoker   . 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 employed @ [employer] 3rd shift  . Alcohol Use: 1.8 oz/week    3 Standard drinks or equivalent per week     Comment: Social  . Drug Use: No  . Sexual Activity:    Partners: Male    Patent examiner Protection: Surgical     Comment: hysterectomy   Other Topics Concern  . None   Social History Narrative     Current outpatient prescriptions:  .  aspirin EC 81 MG tablet, Take 81 mg by mouth daily., Disp: , Rfl:  .  Eszopiclone 3 MG TABS, TAKE 1 TABLET BY MOUTH EVERY NIGHT AT BEDTIME,TAKE IMMEDIATELY BEFORE BEDTIME, Disp: 30 tablet, Rfl: 0 .  gabapentin (NEURONTIN) 400 MG capsule, Take 1 capsule (400 mg total) by mouth 3 (three) times daily. (Patient taking differently: Take 400 mg by mouth daily. ), Disp: 90 capsule, Rfl: 6 .  Multiple Vitamins-Minerals (MULTIVITAMIN & MINERAL PO), Take by mouth daily. Alive 50+ MVI., Disp: , Rfl:  .  PARoxetine (PAXIL) 20 MG tablet, Take 1 tablet (20 mg total) by mouth daily., Disp: 30 tablet, Rfl: 6 .  tiZANidine (ZANAFLEX) 4 MG tablet, TK 1/2 TO 1 T PO TID PRF SPASM, Disp: , Rfl: 1 .  traZODone (DESYREL) 100 MG tablet, TAKE 1 TABLET BY MOUTH EVERY NIGHT AT BEDTIME, Disp: 30 tablet, Rfl: 2 .  valACYclovir (VALTREX) 1000 MG tablet, Take 1 tablet (1,000 mg total) by mouth 3 (three) times daily., Disp: 21 tablet, Rfl: 0 .  traMADol (ULTRAM) 50 MG tablet, Take 1 tablet (50 mg total) by mouth every 8 (eight) hours as needed., Disp: 10 tablet, Rfl: 0  EXAM:  Filed Vitals:   12/01/15 1311  BP: 100/60  Pulse: 77  Temp: 98.2 F (36.8 C)    Body mass index is 24.47 kg/(m^2).  GENERAL: vitals reviewed and listed above, alert, oriented, appears well hydrated and in no acute distress  HEENT: atraumatic, conjunttiva clear, no obvious abnormalities on inspection of external nose and ears  NECK: no obvious masses on inspection  SKIN: Healing rash on the  left lower back wrapping around to the front, no new or fresh vesicular lesions, most lesions have a scab.  MS: moves all extremities without noticeable abnormality  PSYCH: pleasant and cooperative, no obvious depression or anxiety  ASSESSMENT AND PLAN:  Discussed the following assessment and plan:  Shingles  -discussed risks, interactions of various treatment options for pain -she wishes to try tylenol or aleve first and use tramadol rarely and cautiously for severe symptoms - discussed interactions and risks and advised not to take more the every 12 hours -Patient advised to return or notify a doctor immediately if symptoms worsen or persist or new concerns arise.  Patient Instructions   -As we discussed, we have prescribed a new medication for you at this appointment. We discussed the common and serious potential adverse effects of this medication and you can review these and more with the pharmacist when you pick up your medication.  Please follow the instructions for use carefully and notify us immediately if you have any problems taking this medication.  Try tylenol or aleve per instruction for pain. Only use the tramadol cautiously per instructed as needed if other options not working. Follow up with your doctor if any further questions or concerns.      Shingles Shingles, which is also known as herpes zoster, is an infection that causes a painful skin rash and fluid-filled blisters. Shingles is not related to genital herpes, which is a sexually transmitted infection.   Shingles only develops in people who:  Have had chickenpox.  Have received the chickenpox vaccine. (This is rare.) CAUSES Shingles is caused by varicella-zoster virus (VZV). This is the same virus that causes chickenpox. After exposure to VZV, the virus stays in the body in an inactive (dormant) state. Shingles develops if the virus reactivates. This can happen many years after the initial exposure to VZV. It  is not known what causes this virus to reactivate. RISK FACTORS People who have had chickenpox or received the chickenpox vaccine are at risk for shingles. Infection is more common in people who:  Are older than age 2.  Have a weakened defense (immune) system, such as those with HIV, AIDS, or cancer.  Are taking medicines that weaken the immune system, such as transplant medicines.  Are under great stress. SYMPTOMS Early symptoms of this condition include itching, tingling, and pain in an area on your skin. Pain may be described  as burning, stabbing, or throbbing. A few days or weeks after symptoms start, a painful red rash appears, usually on one side of the body in a bandlike or beltlike pattern. The rash eventually turns into fluid-filled blisters that break open, scab over, and dry up in about 2-3 weeks. At any time during the infection, you may also develop:  A fever.  Chills.  A headache.  An upset stomach. DIAGNOSIS This condition is diagnosed with a skin exam. Sometimes, skin or fluid samples are taken from the blisters before a diagnosis is made. These samples are examined under a microscope or sent to a lab for testing. TREATMENT There is no specific cure for this condition. Your health care provider will probably prescribe medicines to help you manage pain, recover more quickly, and avoid long-term problems. Medicines may include:  Antiviral drugs.  Anti-inflammatory drugs.  Pain medicines. If the area involved is on your face, you may be referred to a specialist, such as an eye doctor (ophthalmologist) or an ear, nose, and throat (ENT) doctor to help you avoid eye problems, chronic pain, or disability. HOME CARE INSTRUCTIONS Medicines  Take medicines only as directed by your health care provider.  Apply an anti-itch or numbing cream to the affected area as directed by your health care provider. Blister and Rash Care  Take a cool bath or apply cool compresses to  the area of the rash or blisters as directed by your health care provider. This may help with pain and itching.  Keep your rash covered with a loose bandage (dressing). Wear loose-fitting clothing to help ease the pain of material rubbing against the rash.  Keep your rash and blisters clean with mild soap and cool water or as directed by your health care provider.  Check your rash every day for signs of infection. These include redness, swelling, and pain that lasts or increases.  Do not pick your blisters.  Do not scratch your rash. General Instructions  Rest as directed by your health care provider.  Keep all follow-up visits as directed by your health care provider. This is important.  Until your blisters scab over, your infection can cause chickenpox in people who have never had it or been vaccinated against it. To prevent this from happening, avoid contact with other people, especially:  Babies.  Pregnant women.  Children who have eczema.  Elderly people who have transplants.  People who have chronic illnesses, such as leukemia or AIDS. SEEK MEDICAL CARE IF:  Your pain is not relieved with prescribed medicines.  Your pain does not get better after the rash heals.  Your rash looks infected. Signs of infection include redness, swelling, and pain that lasts or increases. SEEK IMMEDIATE MEDICAL CARE IF:  The rash is on your face or nose.  You have facial pain, pain around your eye area, or loss of feeling on one side of your face.  You have ear pain or you have ringing in your ear.  You have loss of taste.  Your condition gets worse.   This information is not intended to replace advice given to you by your health care provider. Make sure you discuss any questions you have with your health care provider.   Document Released: 05/06/2005 Document Revised: 05/27/2014 Document Reviewed: 03/17/2014 Elsevier Interactive Patient Education 2016 [place]., DO

## 2015-12-01 NOTE — Addendum Note (Signed)
Addended by: Lucretia Kern on: 12/01/2015 02:46 PM   Modules accepted: Orders

## 2015-12-01 NOTE — Progress Notes (Signed)
Pre visit review using our clinic review tool, if applicable. No additional management support is needed unless otherwise documented below in the visit note. 

## 2015-12-01 NOTE — Patient Instructions (Addendum)
-As we discussed, we have prescribed a new medication for you at this appointment. We discussed the common and serious potential adverse effects of this medication and you can review these and more with the pharmacist when you pick up your medication.  Please follow the instructions for use carefully and notify us immediately if you have any problems taking this medication.  Try tylenol or aleve per instruction for pain. Only use the tramadol cautiously per instructed as needed if other options not working. Follow up with your doctor if any further questions or concerns.      Shingles Shingles, which is also known as herpes zoster, is an infection that causes a painful skin rash and fluid-filled blisters. Shingles is not related to genital herpes, which is a sexually transmitted infection.   Shingles only develops in people who:  Have had chickenpox.  Have received the chickenpox vaccine. (This is rare.) CAUSES Shingles is caused by varicella-zoster virus (VZV). This is the same virus that causes chickenpox. After exposure to VZV, the virus stays in the body in an inactive (dormant) state. Shingles develops if the virus reactivates. This can happen many years after the initial exposure to VZV. It is not known what causes this virus to reactivate. RISK FACTORS People who have had chickenpox or received the chickenpox vaccine are at risk for shingles. Infection is more common in people who:  Are older than age 56.  Have a weakened defense (immune) system, such as those with HIV, AIDS, or cancer.  Are taking medicines that weaken the immune system, such as transplant medicines.  Are under great stress. SYMPTOMS Early symptoms of this condition include itching, tingling, and pain in an area on your skin. Pain may be described as burning, stabbing, or throbbing. A few days or weeks after symptoms start, a painful red rash appears, usually on one side of the body in a bandlike or beltlike  pattern. The rash eventually turns into fluid-filled blisters that break open, scab over, and dry up in about 2-3 weeks. At any time during the infection, you may also develop:  A fever.  Chills.  A headache.  An upset stomach. DIAGNOSIS This condition is diagnosed with a skin exam. Sometimes, skin or fluid samples are taken from the blisters before a diagnosis is made. These samples are examined under a microscope or sent to a lab for testing. TREATMENT There is no specific cure for this condition. Your health care provider will probably prescribe medicines to help you manage pain, recover more quickly, and avoid long-term problems. Medicines may include:  Antiviral drugs.  Anti-inflammatory drugs.  Pain medicines. If the area involved is on your face, you may be referred to a specialist, such as an eye doctor (ophthalmologist) or an ear, nose, and throat (ENT) doctor to help you avoid eye problems, chronic pain, or disability. HOME CARE INSTRUCTIONS Medicines  Take medicines only as directed by your health care provider.  Apply an anti-itch or numbing cream to the affected area as directed by your health care provider. Blister and Rash Care  Take a cool bath or apply cool compresses to the area of the rash or blisters as directed by your health care provider. This may help with pain and itching.  Keep your rash covered with a loose bandage (dressing). Wear loose-fitting clothing to help ease the pain of material rubbing against the rash.  Keep your rash and blisters clean with mild soap and cool water or as directed by your health  care provider.  Check your rash every day for signs of infection. These include redness, swelling, and pain that lasts or increases.  Do not pick your blisters.  Do not scratch your rash. General Instructions  Rest as directed by your health care provider.  Keep all follow-up visits as directed by your health care provider. This is  important.  Until your blisters scab over, your infection can cause chickenpox in people who have never had it or been vaccinated against it. To prevent this from happening, avoid contact with other people, especially:  Babies.  Pregnant women.  Children who have eczema.  Elderly people who have transplants.  People who have chronic illnesses, such as leukemia or AIDS. SEEK MEDICAL CARE IF:  Your pain is not relieved with prescribed medicines.  Your pain does not get better after the rash heals.  Your rash looks infected. Signs of infection include redness, swelling, and pain that lasts or increases. SEEK IMMEDIATE MEDICAL CARE IF:  The rash is on your face or nose.  You have facial pain, pain around your eye area, or loss of feeling on one side of your face.  You have ear pain or you have ringing in your ear.  You have loss of taste.  Your condition gets worse.   This information is not intended to replace advice given to you by your health care provider. Make sure you discuss any questions you have with your health care provider.   Document Released: 05/06/2005 Document Revised: 05/27/2014 Document Reviewed: 03/17/2014 Elsevier Interactive Patient Education [employer].

## 2015-12-01 NOTE — Telephone Encounter (Signed)
Patient Name: Rachael Fleming  DOB: January 30, 1961    Initial Comment Caller states dx w/shingles last thursday; what can take for radiating pain in left hip, down to groin; backside and front of chest, upper back;    Nurse Assessment  Nurse: Mallie Mussel, RN, Alveta Heimlich Date/Time (Eastern Time): 12/01/2015 10:55:01 AM  Confirm and document reason for call. If symptomatic, describe symptoms. You must click the next button to save text entered. ---Caller states that she was seen last Thursday. She was diagnosed with shingles and prescribed Valcyclovir. Her pain is getting worse. Wants to know what she can take for this. She is wanting to know if Advil will help with this pain. She does not have any Tylenol on hand. Denies fever. She rates her pain as 7.5 on 0-10 scale.  Has the patient traveled out of the country within the last 30 days? ---No  Does the patient have any new or worsening symptoms? ---Yes  Will a triage be completed? ---Yes  Related visit to physician within the last 2 weeks? ---Yes  Does the PT have any chronic conditions? (i.e. diabetes, asthma, etc.) ---Yes  List chronic conditions. ---Depression  Is this a behavioral health or substance abuse call? ---No     Guidelines    Guideline Title Affirmed Question Affirmed Notes  Shingles [1] Shingles rash AND [2] spots start appearing other places on body    Final Disposition User   See Physician within 4 Hours (or PCP triage) Mallie Mussel, RN, Alveta Heimlich    Comments  I called the backline at [facility] and spoke with Deary. The 4:45pm slot for [name] is on hold. [name] is off today and [name] does not have any 30 minute slots available. I scheduled her to see Dr. Maudie Mercury at 1:00pm. She verbalized understanding.   Referrals  REFERRED TO PCP OFFICE   Disagree/Comply: Comply

## 2015-12-11 ENCOUNTER — Other Ambulatory Visit: Payer: Self-pay | Admitting: Internal Medicine

## 2015-12-27 ENCOUNTER — Other Ambulatory Visit: Payer: Self-pay | Admitting: Internal Medicine

## 2015-12-28 NOTE — Telephone Encounter (Signed)
Will fill, but needs to schedule wellness for this year as last visit 02/2015

## 2015-12-28 NOTE — Telephone Encounter (Signed)
Sent to pharmacy 

## 2015-12-28 NOTE — Telephone Encounter (Signed)
Faxed to pharmacy

## 2016-01-11 ENCOUNTER — Other Ambulatory Visit: Payer: Self-pay | Admitting: Internal Medicine

## 2016-01-17 ENCOUNTER — Encounter: Payer: Self-pay | Admitting: Student

## 2016-01-26 ENCOUNTER — Other Ambulatory Visit: Payer: Self-pay | Admitting: Internal Medicine

## 2016-01-26 NOTE — Telephone Encounter (Signed)
Sent to pharmacy 

## 2016-02-12 ENCOUNTER — Other Ambulatory Visit: Payer: Self-pay | Admitting: Internal Medicine

## 2016-02-15 ENCOUNTER — Other Ambulatory Visit: Payer: Self-pay | Admitting: Internal Medicine

## 2016-02-24 ENCOUNTER — Other Ambulatory Visit: Payer: Self-pay | Admitting: Internal Medicine

## 2016-02-27 NOTE — Telephone Encounter (Signed)
Faxed to pharmacy

## 2016-03-18 ENCOUNTER — Encounter: Payer: Self-pay | Admitting: Obstetrics and Gynecology

## 2016-03-29 ENCOUNTER — Encounter: Payer: Self-pay | Admitting: Internal Medicine

## 2016-03-29 ENCOUNTER — Ambulatory Visit: Payer: Self-pay | Admitting: Internal Medicine

## 2016-03-29 ENCOUNTER — Ambulatory Visit (INDEPENDENT_AMBULATORY_CARE_PROVIDER_SITE_OTHER): Payer: PRIVATE HEALTH INSURANCE | Admitting: Internal Medicine

## 2016-03-29 VITALS — BP 120/82 | HR 62 | Temp 98.4°F | Resp 14 | Ht 62.0 in | Wt 129.8 lb

## 2016-03-29 DIAGNOSIS — Z23 Encounter for immunization: Secondary | ICD-10-CM

## 2016-03-29 DIAGNOSIS — G4700 Insomnia, unspecified: Secondary | ICD-10-CM | POA: Diagnosis not present

## 2016-03-29 DIAGNOSIS — F3289 Other specified depressive episodes: Secondary | ICD-10-CM | POA: Diagnosis not present

## 2016-03-29 MED ORDER — ZOLPIDEM TARTRATE 5 MG PO TABS: 5.0000 mg | ORAL_TABLET | Freq: Every evening | ORAL | 3 refills | Status: DC | PRN

## 2016-03-29 NOTE — Progress Notes (Signed)
   Subjective:    Patient ID: Rachael Fleming, female    DOB: [birth year], 55 y.o.   MRN: [id number]  HPI The patient is a 55 YO female coming in for follow up on her insomnia. 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 She is still having problems getting to sleep. She is using trazodone and lunesta to get to sleep. No hangover symptoms in the morning. No side effects. This is a significant improvement in her QOL when she is able to sleep.   Review of Systems  Constitutional: Negative.   Respiratory: Negative.   Cardiovascular: Negative.   Gastrointestinal: Negative.   Musculoskeletal: Negative.   Neurological: Negative.   Psychiatric/Behavioral: Negative for behavioral problems, confusion, decreased concentration, self-injury, sleep disturbance and suicidal ideas.      Objective:   Physical Exam  Constitutional: She is oriented to person, place, and time. She appears well-developed and well-nourished.  HENT:  Head: Normocephalic and atraumatic.  Eyes: EOM are normal.  Neck: Normal range of motion.  Cardiovascular: Normal rate and regular rhythm.   Pulmonary/Chest: Effort normal and breath sounds normal. No respiratory distress. She has no wheezes. She has no rales.  Abdominal: Soft. She exhibits no distension. There is no tenderness.  Musculoskeletal: She exhibits no edema.  Neurological: She is alert and oriented to person, place, and time.  Skin: Skin is warm and dry.   Vitals:   03/29/16 0846  BP: 120/82  Pulse: 62  Resp: 14  Temp: 98.4 F (36.9 C)  TempSrc: Oral  SpO2: 98%  Weight: 129 lb 12.8 oz (58.9 kg)  Height: 5\' 2"  (1.575 m)      Assessment & Plan:  Flu shot given at visit.

## 2016-03-29 NOTE — Progress Notes (Signed)
Pre visit review using our clinic review tool, if applicable. No additional management support is needed unless otherwise documented below in the visit note. 

## 2016-03-29 NOTE — Assessment & Plan Note (Signed)
Doing well on paxil and her sleep meds. Stable and improving some with her job satisfaction.

## 2016-03-29 NOTE — Assessment & Plan Note (Signed)
Refill trazodone and change lunesta to [place] for cost savings and given good rx coupon at visit. We did discuss the risks and benefits of possible habit forming with sleep aids and risk of increased problems as she ages and she wants to continue with treatment.

## 2016-03-29 NOTE — Patient Instructions (Signed)
We have given you the coupon for the ambien and the prescription to take to the pharmacy.   We have given you the flu shot today.

## 2016-03-31 ENCOUNTER — Other Ambulatory Visit: Payer: Self-pay | Admitting: Internal Medicine

## 2016-04-02 ENCOUNTER — Telehealth: Payer: Self-pay | Admitting: Emergency Medicine

## 2016-04-02 NOTE — Telephone Encounter (Signed)
Pt called and is having some problems with her medication and states it is not working. She wants to know if you can give her a call back about this. Please advise thanks.

## 2016-04-02 NOTE — Telephone Encounter (Signed)
Patient started zolpidem on 03/29/16 and has been waking up a lot. She wants to know if she should be giving it longer to work. Johnnye Sima was d/c'd due to cost, but she says she will pay if necessary. She just wants sleep.

## 2016-04-02 NOTE — Telephone Encounter (Signed)
Ambien is different than lunesta and I would give it 2 weeks to change over in her system. If still having problems we may need to switch back to [place].

## 2016-04-02 NOTE — Telephone Encounter (Signed)
Patient aware.

## 2016-04-20 NOTE — Telephone Encounter (Addendum)
Pt lmom stating that the Lorrin Mais is still not working and is rq rx for [employer]. Please advise.   [facility]

## 2016-04-22 MED ORDER — ESZOPICLONE 3 MG PO TABS: 3.0000 mg | ORAL_TABLET | Freq: Every day | ORAL | 3 refills | Status: DC

## 2016-04-22 NOTE — Addendum Note (Signed)
Addended by: Pricilla Holm A on: 04/22/2016 07:53 AM   Modules accepted: Orders

## 2016-04-22 NOTE — Telephone Encounter (Signed)
Pt informed rx has been faxed to [facility].

## 2016-04-22 NOTE — Telephone Encounter (Signed)
Printed and signed lunesta.

## 2016-04-30 ENCOUNTER — Other Ambulatory Visit: Payer: Self-pay | Admitting: Internal Medicine

## 2016-06-14 ENCOUNTER — Other Ambulatory Visit: Payer: Self-pay | Admitting: Internal Medicine

## 2016-07-11 ENCOUNTER — Other Ambulatory Visit: Payer: Self-pay | Admitting: Internal Medicine

## 2016-08-08 ENCOUNTER — Other Ambulatory Visit: Payer: Self-pay | Admitting: Internal Medicine

## 2016-08-22 ENCOUNTER — Other Ambulatory Visit: Payer: Self-pay | Admitting: Internal Medicine

## 2016-08-23 ENCOUNTER — Ambulatory Visit: Payer: Self-pay | Admitting: Sports Medicine

## 2016-08-28 ENCOUNTER — Telehealth: Payer: Self-pay

## 2016-08-28 MED ORDER — PAROXETINE HCL 20 MG PO TABS: ORAL_TABLET | ORAL | 1 refills | Status: DC

## 2016-08-28 NOTE — Telephone Encounter (Signed)
Med refill

## 2016-08-29 ENCOUNTER — Other Ambulatory Visit: Payer: Self-pay | Admitting: Internal Medicine

## 2016-09-06 ENCOUNTER — Encounter: Payer: Self-pay | Admitting: Sports Medicine

## 2016-09-06 ENCOUNTER — Ambulatory Visit (INDEPENDENT_AMBULATORY_CARE_PROVIDER_SITE_OTHER): Payer: Self-pay | Admitting: Sports Medicine

## 2016-09-06 DIAGNOSIS — M25512 Pain in left shoulder: Secondary | ICD-10-CM

## 2016-09-06 DIAGNOSIS — G8929 Other chronic pain: Secondary | ICD-10-CM

## 2016-09-06 MED ORDER — NITROGLYCERIN 0.2 MG/HR TD PT24: MEDICATED_PATCH | TRANSDERMAL | 1 refills | Status: AC

## 2016-09-06 NOTE — Assessment & Plan Note (Signed)
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 Due to cost limitations empiric nitroglycerin protocol initiated. If any lack of improvement 6 weeks diagnostic MSK ultrasound will be performed at that time. Therapeutic exercises reviewed with patient today with focus on scapular stabilization and supraspinatus eccentric phase.

## 2016-09-06 NOTE — Progress Notes (Signed)
OFFICE VISIT NOTE Rachael Fleming. [place], [place] at [name] - 56 y.o. female MRN [id number]  Date of birth: 08/30/1960  Visit Date: 09/06/2016  PCP: Hoyt Koch, MD   Referred by: Gayla Doss, CMA acting as scribe for Dr. Paulla Fore.  SUBJECTIVE:   Chief Complaint  Patient presents with  . pain in left shoulder   Pain in left shoulder, anterior Pain has been present a couple of months but has increased in intensity over the past 6 weeks. Pt feels like her pain could be due to the way she sleeps or her exercise regimen.  She is a Physiological scientist.  The pain is described as dull at times, shooting pain at times and is rated as 5/10.  Worsened when working, she is a Physiological scientist. Pain is worse when raising arm over head lifting. Improves with icing the shoulder, mixing with heat, tiger balm, sling only minimally.   Therapies tried include ice, heat, icy hot, tiger balm, Ibuprofen, Aleve.  Other associated symptoms include: neck pain, worse in the morning.   Otherwise ROS as it pertains to the Chief Complaint is as below:  No significant radiation past the elbow.  No significant numbness or tingling.    Review of Systems  Constitutional: Negative for chills and fever.  Musculoskeletal: Positive for joint pain and myalgias.  Neurological: Negative for tingling and tremors.    Otherwise per HPI.  HISTORY & PERTINENT PRIOR DATA:  No specialty comments available. She reports that she has never smoked. She has never used smokeless tobacco. No results for input(s): HGBA1C, LABURIC in the last 8760 hours. Medications & Allergies reviewed per EMR Patient Active Problem List   Diagnosis Date Noted  . Chronic left shoulder pain 09/06/2016  . Insomnia 08/29/2014  . Syncope 04/03/2014  . Chronic post-traumatic stress disorder (PTSD) 04/03/2014  . Depression 09/28/2008    . GLAUCOMA 09/28/2008   Past Medical History:  Diagnosis Date  . Abnormal Pap smear 1984   Cryo  . Anxiety   . ARTHRITIS   . DEPRESSION    PTSD complication, working at [employer]  . GERD   . GLAUCOMA   . Heart palpitations 03/2013  . HYPERTENSION   . Migraine   . Vertigo    Family History  Problem Relation Age of Onset  . Arthritis Mother   . Diabetes Mother   . Dementia Mother   . Stroke Mother   . Hypertension Mother   . Alzheimer's disease Mother   . Arthritis Father   . Cancer Father     prostate, metastatic turned into bone  . Stroke Father   . Hypertension Father   . Alcohol abuse Sister     committed suicide  . Diabetes Maternal Grandmother   . Cancer Maternal Grandfather   . Cancer Paternal Grandmother   . Cancer Paternal Grandfather   . Coronary artery disease  24    Grandfather with MI   Past Surgical History:  Procedure Laterality Date  . ABDOMINAL HYSTERECTOMY  2008   TVH  . Bilateral breast reduction  1998  . bladder tack  2008  . BREAST SURGERY    . CERVIX LESION DESTRUCTION  1984  . CESAREAN SECTION  1990  . L3-5 decompression  06/2009  . SHOULDER SURGERY Right   . SKIN GRAFT  2006  . SPINE SURGERY    . TUBAL LIGATION  1/90   Social History   Occupational History  . Not on file.   Social History Main Topics  . Smoking status: Never Smoker  . 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 employed @ [employer] 3rd shift  . Alcohol use 1.8 oz/week    3 Standard drinks or equivalent per week     Comment: Social  . Drug use: No  . Sexual activity: Yes    Partners: Male    Birth control/ protection: Surgical     Comment: hysterectomy    OBJECTIVE:  VS:  HT:5\' 2"  (157.5 cm)   WT:131 lb 3.2 oz (59.5 kg)  BMI:24    BP:110/78  HR:70bpm  TEMP: ( )  RESP:97 % Physical Exam  Constitutional: She appears well-developed and well-nourished. She is cooperative.  Non-toxic appearance.  HENT:  Head: Normocephalic  and atraumatic.  Cardiovascular: Intact distal pulses.   Pulmonary/Chest: No accessory muscle usage. No respiratory distress.  Neurological: She is alert. She is not disoriented. She displays normal reflexes. No sensory deficit.  Skin: Skin is warm, dry and intact. Capillary refill takes less than 2 seconds. No abrasion and no rash noted.  Psychiatric: She has a normal mood and affect. Her speech is normal and behavior is normal. Thought content normal.   Left shoulder: Overall normal alignment.  Muscular.  Slight anterior carriage.  Pain with Luan Pulling, Neer's, empty can speeds and Yergason's.  Internal rotation external rotation strength is 5 out of 5.  3 out of 5 strength with empty can testing.  No asymmetric pain with brachial plexus squeeze or with arm squeeze test.  Full overhead range of motion of both shoulders as well as symmetric cervical rotation  IMAGING & PROCEDURES: No results found. No additional findings.  +++++++++++++++++++++++++++++++++++++++++++++++++++++++++++++++ PROCEDURE NOTE: THERAPEUTIC EXERCISES (97110) 15 minutes spent for Therapeutic exercises as stated in above notes.  This included exercises focusing on stretching, strengthening, with significant focus on eccentric aspects.   Proper technique shown and discussed handout in great detail with ATC.  All questions were discussed and answered.    ASSESSMENT & PLAN:  Visit Diagnoses:  1. Chronic left shoulder pain    Meds:  Meds ordered this encounter  Medications  . nitroGLYCERIN (NITRODUR - DOSED IN MG/24 HR) 0.2 mg/hr patch    Sig: Place 1/4 of patch over affected region. Remove and replace once daily.  Slightly alter skin placement daily    Dispense:  30 patch    Refill:  1    Orders: No orders of the defined types were placed in this encounter.   Follow-up: Return in about 6 weeks (around 10/18/2016) for consider Diagnostic ultrasound.  Otherwise please see problem oriented charting as below.  CMA/ATC  served as Education administrator during this visit. History, Physical, and Plan performed by medical provider. Documentation and orders reviewed and attested to.      Teresa Coombs, DO    [facility]    09/06/2016 1:34 PM

## 2016-09-06 NOTE — Patient Instructions (Addendum)
Please perform the exercise program that Jeneen Rinks has prepared for you and gone over in detail on a daily basis.  In addition to the handout you were provided you can access your program through: [email or web address]   Your unique program code is: [email or web address]   Nitroglycerin Protocol   Apply 1/4 nitroglycerin patch to affected area daily.  Change position of patch within the affected area every 24 hours.  You may experience a headache during the first 1-2 weeks of using the patch, these should subside.  If you experience headaches after beginning nitroglycerin patch treatment, you may take your preferred over the counter pain reliever.  Another side effect of the nitroglycerin patch is skin irritation or rash related to patch adhesive.  Please notify our office if you develop more severe headaches or rash, and stop the patch.  Tendon healing with nitroglycerin patch may require 12 to 24 weeks depending on the extent of injury.  Men should not use if taking Viagra, Cialis, or Levitra.   Do not use if you have migraines or rosacea.

## 2016-09-12 ENCOUNTER — Ambulatory Visit: Payer: No Typology Code available for payment source | Admitting: Certified Nurse Midwife

## 2016-09-19 ENCOUNTER — Other Ambulatory Visit: Payer: Self-pay | Admitting: Internal Medicine

## 2016-10-18 ENCOUNTER — Ambulatory Visit: Payer: Self-pay | Admitting: Sports Medicine

## 2016-10-31 ENCOUNTER — Other Ambulatory Visit: Payer: Self-pay | Admitting: Internal Medicine

## 2016-11-01 ENCOUNTER — Ambulatory Visit: Payer: Self-pay | Admitting: Sports Medicine

## 2016-11-13 ENCOUNTER — Other Ambulatory Visit: Payer: Self-pay | Admitting: Internal Medicine

## 2016-11-19 ENCOUNTER — Other Ambulatory Visit: Payer: Self-pay | Admitting: Internal Medicine

## 2016-11-29 ENCOUNTER — Encounter: Payer: Self-pay | Admitting: Nurse Practitioner

## 2016-11-29 ENCOUNTER — Ambulatory Visit (INDEPENDENT_AMBULATORY_CARE_PROVIDER_SITE_OTHER): Payer: Self-pay | Admitting: Nurse Practitioner

## 2016-11-29 VITALS — BP 124/78 | HR 68 | Temp 98.3°F | Ht 62.0 in | Wt 128.0 lb

## 2016-11-29 DIAGNOSIS — F4312 Post-traumatic stress disorder, chronic: Secondary | ICD-10-CM

## 2016-11-29 DIAGNOSIS — G4700 Insomnia, unspecified: Secondary | ICD-10-CM

## 2016-11-29 DIAGNOSIS — F3289 Other specified depressive episodes: Secondary | ICD-10-CM

## 2016-11-29 MED ORDER — TRAZODONE HCL 100 MG PO TABS: 100.0000 mg | ORAL_TABLET | Freq: Every day | ORAL | 5 refills | Status: AC

## 2016-11-29 MED ORDER — PAROXETINE HCL 20 MG PO TABS: 20.0000 mg | ORAL_TABLET | Freq: Every day | ORAL | 5 refills | Status: AC

## 2016-11-29 NOTE — Progress Notes (Signed)
Subjective:  Patient ID: Rachael Fleming, female    DOB: 1960-09-03  Age: 56 y.o. MRN: [id number]  CC: Follow-up (medication follow up and consult. )  HPI  Depression: Stable  Ran out of medications 1week ago, hence developed hot flashes and restless sleep. Ran out of medication due to lack of money. She pays $22 for paxil and $100 for trazodone at [facility]. Has been out of lunesta, which she will like to hold for now.  Outpatient Medications Prior to Visit  Medication Sig Dispense Refill  . aspirin EC 81 MG tablet Take 81 mg by mouth daily.    . Eszopiclone 3 MG TABS TAKE 1 TABLET BY MOUTH IMMEDIATELY BEFORE BEDTIME 30 tablet 1  . Multiple Vitamins-Minerals (MULTIVITAMIN & MINERAL PO) Take by mouth daily. Alive 50+ MVI.    Marland Kitchen nitroGLYCERIN (NITRODUR - DOSED IN MG/24 HR) 0.2 mg/hr patch Place 1/4 of patch over affected region. Remove and replace once daily.  Slightly alter skin placement daily 30 patch 1  . PARoxetine (PAXIL) 20 MG tablet Take 1 tablet (20 mg total) by mouth daily. Overdue for f/u appt must see MD for refills 30 tablet 0  . traZODone (DESYREL) 100 MG tablet TAKE 1 TABLET BY MOUTH AT BEDTIME...OVERDUE FOR APPT, MUST BE SEENFOR REFILLS 30 tablet 0  . tiZANidine (ZANAFLEX) 4 MG tablet TK 1/2 TO 1 T PO TID PRF SPASM  1   No facility-administered medications prior to visit.     ROS See HPI  Objective:  BP 124/78   Pulse 68   Temp 98.3 F (36.8 C)   Ht 5\' 2"  (1.575 m)   Wt 128 lb (58.1 kg)   LMP 12/18/2005   SpO2 98%   BMI 23.41 kg/m   BP Readings from Last 3 Encounters:  11/29/16 124/78  09/06/16 110/78  03/29/16 120/82    Wt Readings from Last 3 Encounters:  11/29/16 128 lb (58.1 kg)  09/06/16 131 lb 3.2 oz (59.5 kg)  03/29/16 129 lb 12.8 oz (58.9 kg)    Physical Exam  Constitutional: She is oriented to person, place, and time. No distress.  Cardiovascular: Normal rate.   Pulmonary/Chest: Effort normal. No respiratory distress.    Neurological: She is alert and oriented to person, place, and time.  Psychiatric: She has a normal mood and affect. Her behavior is normal. Thought content normal.  Vitals reviewed.   Lab Results  Component Value Date   WBC 7.0 05/04/2015   HGB 12.8 05/04/2015   HCT 36.8 (A) 05/04/2015   PLT 190 04/04/2014   GLUCOSE 82 04/10/2015   CHOL 241 (H) 08/09/2011   TRIG 77.0 08/09/2011   HDL 86.10 08/09/2011   LDLDIRECT 145.9 08/09/2011   ALT 22 04/10/2015   AST 29 04/10/2015   NA 141 04/10/2015   K 4.3 04/10/2015   CL 104 04/10/2015   CREATININE 0.73 04/10/2015   BUN 11 04/10/2015   CO2 28 04/10/2015   TSH 0.723 04/03/2014   INR 0.94 06/26/2009    Mm Screening Breast Tomo Bilateral  Result Date: 05/30/2015 CLINICAL DATA:  Screening. EXAM: DIGITAL SCREENING BILATERAL MAMMOGRAM WITH 3D TOMO WITH CAD COMPARISON:  Previous exam(s). ACR Breast Density Category b: There are scattered areas of fibroglandular density. FINDINGS: There are no findings suspicious for malignancy. Images were processed with CAD. IMPRESSION: No mammographic evidence of malignancy. A result letter of this screening mammogram will be mailed directly to the patient. RECOMMENDATION: Screening mammogram in one year. (Code:[id number]-01Y) BI-RADS CATEGORY  1: Negative. Electronically Signed   By: Abelardo Diesel M.D.   On: 05/31/2015 10:43    Assessment & Plan:   Sahana was seen today for follow-up.  Diagnoses and all orders for this visit:  Insomnia, unspecified type -     PARoxetine (PAXIL) 20 MG tablet; Take 1 tablet (20 mg total) by mouth daily. Overdue for f/u appt must see MD for refills -     traZODone (DESYREL) 100 MG tablet; Take 1 tablet (100 mg total) by mouth at bedtime.  Other depression -     PARoxetine (PAXIL) 20 MG tablet; Take 1 tablet (20 mg total) by mouth daily. Overdue for f/u appt must see MD for refills -     traZODone (DESYREL) 100 MG tablet; Take 1 tablet (100 mg total) by mouth at  bedtime.  Chronic post-traumatic stress disorder (PTSD) -     PARoxetine (PAXIL) 20 MG tablet; Take 1 tablet (20 mg total) by mouth daily. Overdue for f/u appt must see MD for refills -     traZODone (DESYREL) 100 MG tablet; Take 1 tablet (100 mg total) by mouth at bedtime.   I have changed Ms. Brandel's traZODone. I am also having her maintain her Multiple Vitamins-Minerals (MULTIVITAMIN & MINERAL PO), aspirin EC, tiZANidine, nitroGLYCERIN, Eszopiclone, and PARoxetine.  Meds ordered this encounter  Medications  . PARoxetine (PAXIL) 20 MG tablet    Sig: Take 1 tablet (20 mg total) by mouth daily. Overdue for f/u appt must see MD for refills    Dispense:  30 tablet    Refill:  5    Order Specific Question:   Supervising Provider    Answer:   Cassandria Anger [[id number]]  . traZODone (DESYREL) 100 MG tablet    Sig: Take 1 tablet (100 mg total) by mouth at bedtime.    Dispense:  30 tablet    Refill:  5    Order Specific Question:   Supervising Provider    Answer:   Cassandria Anger [[id number]]    Follow-up: Return in about 6 months (around 06/01/2017) for Depression with Dr. Sharlet Salina.  Wilfred Lacy, NP

## 2016-11-29 NOTE — Patient Instructions (Addendum)
Website for medication coupons: [email or web address].

## 2016-12-26 ENCOUNTER — Other Ambulatory Visit: Payer: Self-pay | Admitting: Internal Medicine

## 2017-03-26 ENCOUNTER — Other Ambulatory Visit: Payer: Self-pay | Admitting: Sports Medicine

## 2017-03-26 DIAGNOSIS — G8929 Other chronic pain: Secondary | ICD-10-CM

## 2017-03-26 DIAGNOSIS — M25512 Pain in left shoulder: Principal | ICD-10-CM

## 2017-05-17 ENCOUNTER — Other Ambulatory Visit: Payer: Self-pay | Admitting: Nurse Practitioner

## 2017-05-17 DIAGNOSIS — F3289 Other specified depressive episodes: Secondary | ICD-10-CM

## 2017-05-17 DIAGNOSIS — F4312 Post-traumatic stress disorder, chronic: Secondary | ICD-10-CM

## 2017-05-17 DIAGNOSIS — G4700 Insomnia, unspecified: Secondary | ICD-10-CM

## 2018-11-02 ENCOUNTER — Encounter: Payer: Self-pay | Admitting: Internal Medicine

## 2019-08-09 ENCOUNTER — Encounter: Payer: Self-pay | Admitting: Certified Nurse Midwife

## 2019-12-03 ENCOUNTER — Encounter: Payer: Self-pay | Admitting: Internal Medicine

## 2020-01-10 ENCOUNTER — Other Ambulatory Visit: Payer: Self-pay

## 2020-01-10 ENCOUNTER — Ambulatory Visit (AMBULATORY_SURGERY_CENTER): Payer: Self-pay

## 2020-01-10 VITALS — Ht 62.0 in | Wt 151.0 lb

## 2020-01-10 DIAGNOSIS — Z1211 Encounter for screening for malignant neoplasm of colon: Secondary | ICD-10-CM

## 2020-01-10 MED ORDER — PLENVU 140 G PO SOLR: 1.0000 | Freq: Once | ORAL | 0 refills | Status: AC

## 2020-01-10 NOTE — Progress Notes (Signed)
No egg or soy allergy known to patient  No issues with past sedation with any surgeries or procedures no intubation problems in the past  No FH of Malignant Hyperthermia No diet pills per patient No home 02 use per patient  No blood thinners per patient  Pt denies issues with constipation  No A fib or A flutter  EMMI video to pt or via [place] 19 guidelines implemented in PV today with Pt and RN    Plenvu sample given  Due to the COVID-19 pandemic we are asking patients to follow these guidelines. Please only bring one care partner. Please be aware that your care partner may wait in the car in the parking lot or if they feel like they will be too hot to wait in the car, they may wait in the lobby on the 4th floor. All care partners are required to wear a mask the entire time (we do not have any that we can provide them), they need to practice social distancing, and we will do a Covid check for all patient's and care partners when you arrive. Also we will check their temperature and your temperature. If the care partner waits in their car they need to stay in the parking lot the entire time and we will call them on their cell phone when the patient is ready for discharge so they can bring the car to the front of the building. Also all patient's will need to wear a mask into building.

## 2020-01-27 ENCOUNTER — Other Ambulatory Visit: Payer: Self-pay

## 2020-01-27 ENCOUNTER — Encounter: Payer: Self-pay | Admitting: Internal Medicine

## 2020-01-27 ENCOUNTER — Ambulatory Visit (AMBULATORY_SURGERY_CENTER): Payer: No Typology Code available for payment source | Admitting: Internal Medicine

## 2020-01-27 VITALS — BP 125/79 | HR 62 | Temp 97.0°F | Resp 15 | Ht 62.0 in | Wt 151.0 lb

## 2020-01-27 DIAGNOSIS — D120 Benign neoplasm of cecum: Secondary | ICD-10-CM | POA: Diagnosis not present

## 2020-01-27 DIAGNOSIS — D128 Benign neoplasm of rectum: Secondary | ICD-10-CM

## 2020-01-27 DIAGNOSIS — K5909 Other constipation: Secondary | ICD-10-CM | POA: Diagnosis not present

## 2020-01-27 DIAGNOSIS — Z1211 Encounter for screening for malignant neoplasm of colon: Secondary | ICD-10-CM

## 2020-01-27 DIAGNOSIS — K621 Rectal polyp: Secondary | ICD-10-CM

## 2020-01-27 MED ORDER — SODIUM CHLORIDE 0.9 % IV SOLN: 500.0000 mL | INTRAVENOUS | Status: DC

## 2020-01-27 NOTE — Patient Instructions (Signed)
YOU HAD AN ENDOSCOPIC PROCEDURE TODAY AT [facility]:   Refer to the procedure report that was given to you for any specific questions about what was found during the examination.  If the procedure report does not answer your questions, please call your gastroenterologist to clarify.  If you requested that your care partner not be given the details of your procedure findings, then the procedure report has been included in a sealed envelope for you to review at your convenience later.  YOU SHOULD EXPECT: Some feelings of bloating in the abdomen. Passage of more gas than usual.  Walking can help get rid of the air that was put into your GI tract during the procedure and reduce the bloating. If you had a lower endoscopy (such as a colonoscopy or flexible sigmoidoscopy) you may notice spotting of blood in your stool or on the toilet paper. If you underwent a bowel prep for your procedure, you may not have a normal bowel movement for a few days.  Please Note:  You might notice some irritation and congestion in your nose or some drainage.  This is from the oxygen used during your procedure.  There is no need for concern and it should clear up in a day or so.  SYMPTOMS TO REPORT IMMEDIATELY:   Following lower endoscopy (colonoscopy or flexible sigmoidoscopy):  Excessive amounts of blood in the stool  Significant tenderness or worsening of abdominal pains  Swelling of the abdomen that is new, acute  Fever of 100F or high  For urgent or emergent issues, a gastroenterologist can be reached at any hour by calling [phone]. Do not use MyChart messaging for urgent concerns.    DIET:  We do recommend a small meal at first, but then you may proceed to your regular diet.  Drink plenty of fluids but you should avoid alcoholic beverages for 24 hours.  ACTIVITY:  You should plan to take it easy for the rest of today and you should NOT DRIVE or use heavy machinery until tomorrow (because of  the sedation medicines used during the test).    FOLLOW UP: Our staff will call the number listed on your records 48-72 hours following your procedure to check on you and address any questions or concerns that you may have regarding the information given to you following your procedure. If we do not reach you, we will leave a message.  We will attempt to reach you two times.  During this call, we will ask if you have developed any symptoms of COVID 19. If you develop any symptoms (ie: fever, flu-like symptoms, shortness of breath, cough etc.) before then, please call [phone].  If you test positive for Covid 19 in the 2 weeks post procedure, please call and report this information to [place].    If any biopsies were taken you will be contacted by phone or by letter within the next 1-3 weeks.  Please call us at [phone] if you have not heard about the biopsies in 3 weeks.    SIGNATURES/CONFIDENTIALITY: You and/or your care partner have signed paperwork which will be entered into your electronic medical record.  These signatures attest to the fact that that the information above on your After Visit Summary has been reviewed and is understood.  Full responsibility of the confidentiality of this discharge information lies with you and/or your care-partner.

## 2020-01-27 NOTE — Progress Notes (Signed)
Called to room to assist during endoscopic procedure.  Patient ID and intended procedure confirmed with present staff. Received instructions for my participation in the procedure from the performing physician.  

## 2020-01-27 NOTE — Progress Notes (Signed)
Vs CW  Pt's states no medical or surgical changes since previsit or office visit.  

## 2020-01-27 NOTE — Op Note (Signed)
[facility] Patient Name: Rachael Fleming Procedure Date: 01/27/2020 9:50 AM MRN: [id number] Endoscopist: Docia Chuck. Henrene Pastor , MD Age: 59 Referring MD:  Date of Birth: [birth year] Gender: Female Account #: [id number] Procedure:                Colonoscopy with cold snare polypectomy x 1; with                            biopsies Indications:              Screening for colorectal malignant neoplasm. Normal                            index exam 2010. Incidental complaint of chronic                            constipation Medicines:                Monitored Anesthesia Care Procedure:                Pre-Anesthesia Assessment:                           - Prior to the procedure, a History and Physical                            was performed, and patient medications and                            allergies were reviewed. The patient's tolerance of                            previous anesthesia was also reviewed. The risks                            and benefits of the procedure and the sedation                            options and risks were discussed with the patient.                            All questions were answered, and informed consent                            was obtained. Prior Anticoagulants: The patient has                            taken no previous anticoagulant or antiplatelet                            agents. ASA Grade Assessment: II - A patient with                            mild systemic disease. After reviewing the risks  and benefits, the patient was deemed in                            satisfactory condition to undergo the procedure.                           After obtaining informed consent, the colonoscope                            was passed under direct vision. Throughout the                            procedure, the patient's blood pressure, pulse, and                            oxygen saturations were monitored continuously.  The                            Colonoscope was introduced through the anus and                            advanced to the the cecum, identified by                            appendiceal orifice and ileocecal valve. The                            ileocecal valve, appendiceal orifice, and rectum                            were photographed. The quality of the bowel                            preparation was excellent. The colonoscopy was                            performed without difficulty. The patient tolerated                            the procedure well. The bowel preparation used was                            SUPREP via split dose instruction. Scope In: 10:04:27 AM Scope Out: 10:19:28 AM Scope Withdrawal Time: 0 hours 8 minutes 57 seconds  Total Procedure Duration: 0 hours 15 minutes 1 second  Findings:                 A 1 mm polyp was found in the cecum. The polyp was                            removed with a cold snare. Resection and retrieval                            were complete.  A less than 1 mm polyp was found in the distal                            rectum, just above the anal verge. Biopsies were                            taken with a cold forceps for histology.                           The exam was otherwise without abnormality on                            direct and retroflexion views. Complications:            No immediate complications. Estimated blood loss:                            None. Estimated Blood Loss:     Estimated blood loss: none. Impression:               - One 1 mm polyp in the cecum, removed with a cold                            snare. Resected and retrieved.                           - One less than 1 mm polyp in the distal rectum.                            Biopsied.                           - The examination was otherwise normal on direct                            and retroflexion views. Recommendation:            - Repeat colonoscopy in 10 years for surveillance.                           - Patient has a contact number available for                            emergencies. The signs and symptoms of potential                            delayed complications were discussed with the                            patient. Return to normal activities tomorrow.                            Written discharge instructions were provided to the  patient.                           - Resume previous diet.                           - Continue present medications.                           - Await pathology results.                           - For chronic constipation, try MiraLAX daily. You                            may adjust the dose as needed to achieve desired                            effect. Docia Chuck. Henrene Pastor, MD 01/27/2020 10:27:35 AM This report has been signed electronically.

## 2020-01-27 NOTE — Progress Notes (Signed)
Report to PACU, RN, vss, BBS= Clear.  

## 2020-01-31 ENCOUNTER — Telehealth: Payer: Self-pay | Admitting: *Deleted

## 2020-01-31 NOTE — Telephone Encounter (Signed)
1. Have you developed a fever since your procedure? no  2.   Have you had an respiratory symptoms (SOB or cough) since your procedure? no  3.   Have you tested positive for COVID 19 since your procedure no  4.   Have you had any family members/close contacts diagnosed with the COVID 19 since your procedure?  no   If yes to any of these questions please route to Joylene John, RN and Joella Prince, RN Follow up Call-  Call back number 01/27/2020  Post procedure Call Back phone  # [phone]  Permission to leave phone message Yes  Some recent data might be hidden     Patient questions:  Do you have a fever, pain , or abdominal swelling? No. Pain Score  0 *  Have you tolerated food without any problems? Yes.    Have you been able to return to your normal activities? Yes.    Do you have any questions about your discharge instructions: Diet   No. Medications  No. Follow up visit  No.  Do you have questions or concerns about your Care? No.  Actions: * If pain score is 4 or above: No action needed, pain <4.

## 2020-02-01 ENCOUNTER — Encounter: Payer: Self-pay | Admitting: Internal Medicine

## 2020-06-27 ENCOUNTER — Other Ambulatory Visit: Payer: Self-pay

## 2020-06-27 ENCOUNTER — Encounter (HOSPITAL_COMMUNITY): Payer: Self-pay | Admitting: Emergency Medicine

## 2020-06-27 ENCOUNTER — Ambulatory Visit (HOSPITAL_COMMUNITY)
Admission: EM | Admit: 2020-06-27 | Discharge: 2020-06-27 | Disposition: A | Discharge status: Home / Self Care | Payer: Non-veteran care | Attending: Student | Admitting: Student

## 2020-06-27 ENCOUNTER — Ambulatory Visit (INDEPENDENT_AMBULATORY_CARE_PROVIDER_SITE_OTHER): Payer: Non-veteran care

## 2020-06-27 DIAGNOSIS — S9031XA Contusion of right foot, initial encounter: Secondary | ICD-10-CM

## 2020-06-27 DIAGNOSIS — M7989 Other specified soft tissue disorders: Secondary | ICD-10-CM | POA: Diagnosis not present

## 2020-06-27 DIAGNOSIS — S9781XA Crushing injury of right foot, initial encounter: Secondary | ICD-10-CM

## 2020-06-27 DIAGNOSIS — M79671 Pain in right foot: Secondary | ICD-10-CM
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
## 2020-06-27 NOTE — Discharge Instructions (Addendum)
-  Keep your boot on during the day and while walking for the next 1-2 weeks. As your pain improves you can start leaving this off as tolerated.  -Ibuprofen/tylenol for pain control.  -Follow-up with orthopedist if your symptoms worsen/persist (information below)
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
## 2021-02-05 ENCOUNTER — Encounter (HOSPITAL_COMMUNITY): Payer: Self-pay | Admitting: Emergency Medicine

## 2021-02-05 ENCOUNTER — Ambulatory Visit (HOSPITAL_COMMUNITY)
Admission: EM | Admit: 2021-02-05 | Discharge: 2021-02-05 | Disposition: A | Discharge status: Home / Self Care | Payer: Non-veteran care | Attending: Emergency Medicine | Admitting: Emergency Medicine

## 2021-02-05 ENCOUNTER — Other Ambulatory Visit: Payer: Self-pay

## 2021-02-05 DIAGNOSIS — R110 Nausea: Secondary | ICD-10-CM | POA: Diagnosis not present

## 2021-02-05 DIAGNOSIS — R197 Diarrhea, unspecified: Secondary | ICD-10-CM | POA: Diagnosis not present

## 2021-02-05 MED ORDER — ALUMINUM-MAGNESIUM-SIMETHICONE 200-200-20 MG/5ML PO SUSP: 30.0000 mL | Freq: Three times a day (TID) | ORAL | 1 refills | Status: AC

## 2021-02-05 MED ORDER — DICYCLOMINE HCL 20 MG PO TABS: 20.0000 mg | ORAL_TABLET | Freq: Two times a day (BID) | ORAL | 0 refills | Status: DC

## 2021-02-05 MED ORDER — LOPERAMIDE HCL 2 MG PO CAPS: 2.0000 mg | ORAL_CAPSULE | Freq: Two times a day (BID) | ORAL | 0 refills | Status: AC | PRN

## 2021-02-05 NOTE — ED Provider Notes (Signed)
[facility] [facility]    CSN: [id number] Arrival date & time: 02/05/21  1438      History   Chief Complaint Chief Complaint  Patient presents with   Diarrhea   Nausea    HPI Rachael Fleming is a 60 y.o. female.   Patient presents with nausea during and after eating and bowel movements are occurring 10 to 15 minutes after meals then reoccurring 3x4 times after for 3 weeks.  Symptoms started spontaneously.  stools are firm and brown but progress to soft and watery as they recur.  The symptoms are occurring with every meal despite what she eats.  Endorses increased bloating and gas production, heartburn and indigestion.  Last colonoscopy 01/2020, normal with polyp removed, recommended 10-year follow-up.  History of constipation.  Continue use of Pepto-Bismol and probiotic with no improvement.  Endorses that she does have a stressful job but there has been no change in workload recently.  Denies recent travel or changes to diet, hematuria, abdominal pain.  Past Medical History:  Diagnosis Date   Abnormal Pap smear 1984   Cryo   Anxiety    ARTHRITIS    Arthritis    osteoarthritis of bilateral shoulders   DEPRESSION    PTSD complication, working at [employer]   GERD    GLAUCOMA    Glaucoma    Heart palpitations 03/2013   Hyperlipidemia    HYPERTENSION    Migraine    Vertigo     Patient Active Problem List   Diagnosis Date Noted   Chronic left shoulder pain 09/06/2016   Insomnia 08/29/2014   Syncope 04/03/2014   Chronic post-traumatic stress disorder (PTSD) 04/03/2014   Depression 09/28/2008   GLAUCOMA 09/28/2008    Past Surgical History:  Procedure Laterality Date   ABDOMINAL HYSTERECTOMY  2008   TVH   Bilateral breast reduction  1998   bladder tack  2008   BREAST SURGERY     CERVIX LESION DESTRUCTION  [place]   COLONOSCOPY  2010   L3-5 decompression  06/2009   SHOULDER SURGERY Right    SKIN GRAFT  2006   SPINE SURGERY     TUBAL  LIGATION  1/90    OB History     Gravida  4   Para  2   Term  2   Preterm      AB  2   Living  2      SAB      IAB      Ectopic      Multiple      Live Births  2            Home Medications    Prior to Admission medications   Medication Sig Start Date End Date Taking? Authorizing Provider  aluminum-magnesium hydroxide-simethicone (MAALOX) [id number] MG/5ML SUSP Take 30 mLs by mouth 4 (four) times daily -  before meals and at bedtime. 02/05/21  Yes Galen Russman R, NP  dicyclomine (BENTYL) 20 MG tablet Take 1 tablet (20 mg total) by mouth 2 (two) times daily. 02/05/21  Yes Cortlan Dolin, Leitha Schuller, NP  loperamide (IMODIUM) 2 MG capsule Take 1 capsule (2 mg total) by mouth 2 (two) times daily as needed for diarrhea or loose stools. 02/05/21  Yes Ameliarose Shark R, NP  AMLODIPINE BENZOATE PO Take 2.5 mg by mouth daily.    [provider]  aspirin EC 81 MG tablet Take 81 mg by mouth daily.  [provider]  busPIRone (BUSPAR) 10 MG tablet Take 10 mg by mouth 2 (two) times daily.    [provider]  Charcoal Activated (ACTIVATED CHARCOAL) POWD Take 1 capsule by mouth in the morning and at bedtime. 260 mg    [provider]  escitalopram (LEXAPRO) 20 MG tablet Take 100 mg by mouth daily.    [provider]  Eszopiclone 3 MG TABS TAKE 1 TABLET BY MOUTH IMMEDIATELY BEFORE BEDTIME 09/20/16   Hoyt Koch, MD  Multiple Vitamins-Minerals (MULTIVITAMIN & MINERAL PO) Take by mouth daily. Alive 50+ MVI.    [provider]  nitroGLYCERIN (NITRODUR - DOSED IN MG/24 HR) 0.2 mg/hr patch Place 1/4 of patch over affected region. Remove and replace once daily.  Slightly alter skin placement daily Patient not taking: Reported on 01/27/2020 09/06/16   Gerda Diss, DO  PARoxetine (PAXIL) 20 MG tablet Take 1 tablet (20 mg total) by mouth daily. Overdue for f/u appt must see MD for refills 11/29/16   Nche, Charlene Brooke, NP   pravastatin (PRAVACHOL) 40 MG tablet Take 40 mg by mouth daily.    [provider]  Probiotic Product (PROBIOTIC ACIDOPHILUS BEADS) CAPS Take 1 capsule by mouth in the morning and at bedtime.    [provider]  tiZANidine (ZANAFLEX) 4 MG tablet TK 1/2 TO 1 T PO TID PRF SPASM 11/10/15   [provider]  traZODone (DESYREL) 100 MG tablet Take 1 tablet (100 mg total) by mouth at bedtime. 11/29/16   Nche, Charlene Brooke, NP    Family History Family History  Problem Relation Age of Onset   Arthritis Mother    Diabetes Mother    Dementia Mother    Stroke Mother    Hypertension Mother    Alzheimer's disease Mother    Arthritis Father    Cancer Father        prostate, metastatic turned into bone   Stroke Father    Hypertension Father    Alcohol abuse Sister        committed suicide   Diabetes Maternal Grandmother    Cancer Maternal Grandfather    Cancer Paternal Grandmother    Cancer Paternal Grandfather    Coronary artery disease Other 48       Grandfather with MI   Colon cancer Neg Hx    Colon polyps Neg Hx    Esophageal cancer Neg Hx    Rectal cancer Neg Hx    Stomach cancer Neg Hx     Social History Social History   Tobacco Use   Smoking status: Never   Smokeless tobacco: Never   Tobacco comments:    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   HENT: Negative.    Respiratory: Negative.    Cardiovascular: Negative.   Gastrointestinal:  Positive for diarrhea and nausea. Negative for abdominal distention, abdominal pain, anal bleeding, blood in stool, constipation, rectal pain and vomiting.  Genitourinary: Negative.   Skin:  Negative.     Physical Exam Triage Vital Signs ED Triage Vitals  Enc Vitals Group  BP 02/05/21 1559 (!) 148/94     Pulse Rate 02/05/21 1559 68     Resp 02/05/21 1559 15     Temp 02/05/21 1559 98.1 F (36.7 C)     Temp Source 02/05/21 1559 Oral     SpO2 02/05/21 1559 99 %     Weight --      Height --      Head Circumference --      Peak Flow --      Pain Score 02/05/21 1558 0     Pain Loc --      Pain Edu? --      Excl. in [place]? --    No data found.  Updated Vital Signs BP (!) 148/94 (BP Location: Right Arm)   Pulse 68   Temp 98.1 F (36.7 C) (Oral)   Resp 15   LMP 12/18/2005   SpO2 99%   Visual Acuity Right Eye Distance:   Left Eye Distance:   Bilateral Distance:    Right Eye Near:   Left Eye Near:    Bilateral Near:     Physical Exam Constitutional:      Appearance: Normal appearance. She is normal weight.  HENT:     Head: Normocephalic.  Eyes:     Extraocular Movements: Extraocular movements intact.  Pulmonary:     Effort: Pulmonary effort is normal.  Abdominal:     General: Abdomen is flat. Bowel sounds are increased.     Palpations: Abdomen is soft.     Tenderness: There is abdominal tenderness in the right lower quadrant. There is no right CVA tenderness, left CVA tenderness or guarding. Negative signs include Murphy's sign and McBurney's sign.  Skin:    General: Skin is warm and dry.  Neurological:     Mental Status: She is alert and oriented to person, place, and time. Mental status is at baseline.  Psychiatric:        Mood and Affect: Mood normal.        Behavior: Behavior normal.     UC Treatments / Results  Labs (all labs ordered are listed, but only abnormal results are displayed) Labs Reviewed - No data to display  EKG   Radiology No results found.  Procedures Procedures (including critical care time)  Medications Ordered in UC Medications - No data to display  Initial Impression / Assessment and Plan / UC Course  I have  reviewed the triage vital signs and the nursing notes.  Pertinent labs & imaging results that were available during my care of the patient were reviewed by me and considered in my medical decision making (see chart for details).  Diarrhea Nausea with vomiting  No red flag symptoms, low suspicion of infectious cause, vital signs stable, discussed with patient, recommended follow-up with gastrointestinal specialist for further evaluation  1.  Bentyl 20 mg twice daily for 10 days 2.  Imodium 2 mg twice daily as needed 3.  Maalox 200-2 100-20 mg/5 mils 4 times a day 4.  Continue daily use of probiotic 5.  Recommended small frequent meals, separating food and liquid intake by 30 minutes recommended avoiding simple sugars pain consuming a high-fiber protein rich diet Final Clinical Impressions(s) / UC Diagnoses   Final diagnoses:  Diarrhea, unspecified type  Nausea without vomiting     Discharge Instructions      Please set up appointment with [facility] for further evaluation of symptoms  You may take Bentyl twice a day for the  next 10 days  You may use Maalox 4 times a day, before meals and at bedtime to help with heartburn, indigestion, bloating and gas production  You may use Imodium as needed up to twice a day to help with diarrhea, use 1 dose to see how it affects your body before continue use, overuse may have the opposite effect of constipation  May attempt to follow to see if they also help symptoms  avoid foods that are high in simple sugar content and replace them with a diet consisting of high-fiber, complex carbohydrate, and protein-rich foods.    small, frequent meals and separating solids from liquid intake by 30 minute  Continue use of daily probiotic   ED Prescriptions     Medication Sig Dispense Auth. Provider   loperamide (IMODIUM) 2 MG capsule Take 1 capsule (2 mg total) by mouth 2 (two) times daily as needed for diarrhea or loose stools. 12 capsule Tallyn Holroyd,  Saina Waage R, NP   dicyclomine (BENTYL) 20 MG tablet Take 1 tablet (20 mg total) by mouth 2 (two) times daily. 20 tablet Lowella Petties R, NP   aluminum-magnesium hydroxide-simethicone (MAALOX) [id number] MG/5ML SUSP Take 30 mLs by mouth 4 (four) times daily -  before meals and at bedtime. 1,680 mL Hans Eden, NP      PDMP not reviewed this encounter.   Hans Eden, NP 02/05/21 1712

## 2021-02-05 NOTE — ED Triage Notes (Signed)
Pt reports for past 3 weeks reports nauseated after eating and then will have loose stools.

## 2021-02-05 NOTE — Discharge Instructions (Signed)
Please set up appointment with [facility] for further evaluation of symptoms  You may take Bentyl twice a day for the next 10 days  You may use Maalox 4 times a day, before meals and at bedtime to help with heartburn, indigestion, bloating and gas production  You may use Imodium as needed up to twice a day to help with diarrhea, use 1 dose to see how it affects your body before continue use, overuse may have the opposite effect of constipation  May attempt to follow to see if they also help symptoms  avoid foods that are high in simple sugar content and replace them with a diet consisting of high-fiber, complex carbohydrate, and protein-rich foods.    small, frequent meals and separating solids from liquid intake by 30 minute  Continue use of daily probiotic

## 2021-04-27 ENCOUNTER — Encounter (HOSPITAL_COMMUNITY): Payer: Self-pay | Admitting: Emergency Medicine

## 2021-04-27 ENCOUNTER — Ambulatory Visit (HOSPITAL_COMMUNITY)
Admission: EM | Admit: 2021-04-27 | Discharge: 2021-04-27 | Disposition: A | Discharge status: Home / Self Care | Payer: No Typology Code available for payment source | Attending: Emergency Medicine | Admitting: Emergency Medicine

## 2021-04-27 DIAGNOSIS — J40 Bronchitis, not specified as acute or chronic: Secondary | ICD-10-CM | POA: Diagnosis not present

## 2021-04-27 DIAGNOSIS — J069 Acute upper respiratory infection, unspecified: Secondary | ICD-10-CM | POA: Diagnosis not present

## 2021-04-27 LAB — POC INFLUENZA A AND B ANTIGEN (URGENT CARE ONLY)
INFLUENZA A ANTIGEN, POC: NEGATIVE
INFLUENZA B ANTIGEN, POC: NEGATIVE

## 2021-04-27 MED ORDER — BENZONATATE 100 MG PO CAPS: 100.0000 mg | ORAL_CAPSULE | Freq: Three times a day (TID) | ORAL | 0 refills | Status: DC

## 2021-04-27 MED ORDER — PREDNISONE 10 MG (21) PO TBPK: ORAL_TABLET | Freq: Every day | ORAL | 0 refills | Status: DC

## 2021-04-27 MED ORDER — ALBUTEROL SULFATE HFA 108 (90 BASE) MCG/ACT IN AERS: 1.0000 | INHALATION_SPRAY | Freq: Four times a day (QID) | RESPIRATORY_TRACT | 0 refills | Status: AC | PRN

## 2021-04-27 NOTE — ED Provider Notes (Signed)
[place]    CSN: [id number] Arrival date & time: 04/27/21  0946      History   Chief Complaint Chief Complaint  Patient presents with   Cough   Nasal Congestion    HPI Rachael Fleming is a 60 y.o. female.   Cough, congestion, sore throat, and chills for the past week. Has been taking otc cough meds , theraflu and mucinex with no relief.    Past Medical History:  Diagnosis Date   Abnormal Pap smear 1984   Cryo   Anxiety    ARTHRITIS    Arthritis    osteoarthritis of bilateral shoulders   DEPRESSION    PTSD complication, working at [employer]   GERD    GLAUCOMA    Glaucoma    Heart palpitations 03/2013   Hyperlipidemia    HYPERTENSION    Migraine    Vertigo     Patient Active Problem List   Diagnosis Date Noted   Chronic left shoulder pain 09/06/2016   Insomnia 08/29/2014   Syncope 04/03/2014   Chronic post-traumatic stress disorder (PTSD) 04/03/2014   Depression 09/28/2008   GLAUCOMA 09/28/2008    Past Surgical History:  Procedure Laterality Date   ABDOMINAL HYSTERECTOMY  2008   TVH   Bilateral breast reduction  1998   bladder tack  2008   BREAST SURGERY     CERVIX LESION DESTRUCTION  [place]   COLONOSCOPY  2010   L3-5 decompression  06/2009   SHOULDER SURGERY Right    SKIN GRAFT  2006   SPINE SURGERY     TUBAL LIGATION  1/90    OB History     Gravida  4   Para  2   Term  2   Preterm      AB  2   Living  2      SAB      IAB      Ectopic      Multiple      Live Births  2            Home Medications    Prior to Admission medications   Medication Sig Start Date End Date Taking? Authorizing Provider  albuterol (VENTOLIN HFA) 108 (90 Base) MCG/ACT inhaler Inhale 1-2 puffs into the lungs every 6 (six) hours as needed for wheezing or shortness of breath. 04/27/21  Yes Marney Setting, NP  benzonatate (TESSALON) 100 MG capsule Take 1 capsule (100 mg total) by mouth every 8  (eight) hours. 04/27/21  Yes Marney Setting, NP  predniSONE (STERAPRED UNI-PAK 21 TAB) 10 MG (21) TBPK tablet Take by mouth daily. Take 6 tabs by mouth daily  for 2 days, then 5 tabs for 2 days, then 4 tabs for 2 days, then 3 tabs for 2 days, 2 tabs for 2 days, then 1 tab by mouth daily for 2 days 04/27/21  Yes Marney Setting, NP  aluminum-magnesium hydroxide-simethicone (MAALOX) [phone] MG/5ML SUSP Take 30 mLs by mouth 4 (four) times daily -  before meals and at bedtime. 02/05/21   White, Leitha Schuller, NP  AMLODIPINE BENZOATE PO Take 2.5 mg by mouth daily.    [provider]  aspirin EC 81 MG tablet Take 81 mg by mouth daily.    [provider]  busPIRone (BUSPAR) 10 MG tablet Take 10 mg by mouth 2 (two) times daily.    [provider]  Charcoal Activated (ACTIVATED  CHARCOAL) POWD Take 1 capsule by mouth in the morning and at bedtime. 260 mg    [provider]  dicyclomine (BENTYL) 20 MG tablet Take 1 tablet (20 mg total) by mouth 2 (two) times daily. 02/05/21   White, Leitha Schuller, NP  escitalopram (LEXAPRO) 20 MG tablet Take 100 mg by mouth daily.    [provider]  Eszopiclone 3 MG TABS TAKE 1 TABLET BY MOUTH IMMEDIATELY BEFORE BEDTIME 09/20/16   Hoyt Koch, MD  loperamide (IMODIUM) 2 MG capsule Take 1 capsule (2 mg total) by mouth 2 (two) times daily as needed for diarrhea or loose stools. 02/05/21   Hans Eden, NP  Multiple Vitamins-Minerals (MULTIVITAMIN & MINERAL PO) Take by mouth daily. Alive 50+ MVI.    [provider]  nitroGLYCERIN (NITRODUR - DOSED IN MG/24 HR) 0.2 mg/hr patch Place 1/4 of patch over affected region. Remove and replace once daily.  Slightly alter skin placement daily Patient not taking: Reported on 01/27/2020 09/06/16   Gerda Diss, DO  PARoxetine (PAXIL) 20 MG tablet Take 1 tablet (20 mg total) by mouth daily. Overdue for f/u appt must see MD for refills 11/29/16   Nche, Charlene Brooke, NP   pravastatin (PRAVACHOL) 40 MG tablet Take 40 mg by mouth daily.    [provider]  Probiotic Product (PROBIOTIC ACIDOPHILUS BEADS) CAPS Take 1 capsule by mouth in the morning and at bedtime.    [provider]  tiZANidine (ZANAFLEX) 4 MG tablet TK 1/2 TO 1 T PO TID PRF SPASM 11/10/15   [provider]  traZODone (DESYREL) 100 MG tablet Take 1 tablet (100 mg total) by mouth at bedtime. 11/29/16   Nche, Charlene Brooke, NP    Family History Family History  Problem Relation Age of Onset   Arthritis Mother    Diabetes Mother    Dementia Mother    Stroke Mother    Hypertension Mother    Alzheimer's disease Mother    Arthritis Father    Cancer Father        prostate, metastatic turned into bone   Stroke Father    Hypertension Father    Alcohol abuse Sister        committed suicide   Diabetes Maternal Grandmother    Cancer Maternal Grandfather    Cancer Paternal Grandmother    Cancer Paternal Grandfather    Coronary artery disease Other 57       Grandfather with MI   Colon cancer Neg Hx    Colon polyps Neg Hx    Esophageal cancer Neg Hx    Rectal cancer Neg Hx    Stomach cancer Neg Hx     Social History Social History   Tobacco Use   Smoking status: Never   Smokeless tobacco: Never   Tobacco comments:    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  HENT:  Positive for congestion, postnasal drip, rhinorrhea, sinus pressure, sinus pain, sneezing and sore throat.   Eyes: Negative.   Respiratory:  Positive for cough and wheezing. Negative for  shortness of breath.   Cardiovascular: Negative.    Gastrointestinal: Negative.   Genitourinary: Negative.   Neurological: Negative.     Physical Exam Triage Vital Signs ED Triage Vitals  Enc Vitals Group     BP 04/27/21 1123 (!) 175/94     Pulse Rate 04/27/21 1123 83     Resp 04/27/21 1123 17     Temp 04/27/21 1123 100.1 F (37.8 C)     Temp Source 04/27/21 1123 Oral     SpO2 04/27/21 1123 100 %     Weight --      Height --      Head Circumference --      Peak Flow --      Pain Score 04/27/21 1122 0     Pain Loc --      Pain Edu? --      Excl. in [place]? --    No data found.  Updated Vital Signs BP (!) 168/99 (BP Location: Left Arm)   Pulse 83   Temp 100.1 F (37.8 C) (Oral)   Resp 17   LMP 12/18/2005   SpO2 100%   Visual Acuity Right Eye Distance:   Left Eye Distance:   Bilateral Distance:    Right Eye Near:   Left Eye Near:    Bilateral Near:     Physical Exam Constitutional:      Appearance: Normal appearance.  HENT:     Right Ear: Tympanic membrane normal.     Left Ear: Tympanic membrane normal.     Nose: Congestion and rhinorrhea present.     Mouth/Throat:     Pharynx: Posterior oropharyngeal erythema present. No oropharyngeal exudate.  Eyes:     Pupils: Pupils are equal, round, and reactive to light.  Cardiovascular:     Rate and Rhythm: Normal rate.  Pulmonary:     Breath sounds: Wheezing present.  Abdominal:     General: Abdomen is flat.  Musculoskeletal:        General: Normal range of motion.     Cervical back: Normal range of motion.  Skin:    General: Skin is warm.     Capillary Refill: Capillary refill takes less than 2 seconds.  Neurological:     General: No focal deficit present.     Mental Status: She is alert.     UC Treatments / Results  Labs (all labs ordered are listed, but only abnormal results are displayed) Labs Reviewed  POC INFLUENZA A AND B ANTIGEN (URGENT CARE ONLY)    EKG   Radiology No results found.  Procedures Procedures (including critical care  time)  Medications Ordered in UC Medications - No data to display  Initial Impression / Assessment and Plan / UC Course  I have reviewed the triage vital signs and the nursing notes.  Pertinent labs & imaging results that were available during my care of the patient were reviewed by me and considered in my medical decision making (see chart for details).     Take cough meds as needed  Cont the otc cold meds as needed  Use inhaler as needed for cough or sob  Go to er if symptoms become worse  Use humidifier as needed  Take tylenol or motrin for pain or fever   Final Clinical Impressions(s) / UC Diagnoses   Final diagnoses:  Viral URI with cough  Bronchitis     Discharge Instructions      Take cough meds as needed  Cont the  otc cold meds as needed  Use inhaler as needed for cough or sob  Go to er if symptoms become worse  Use humidifier as needed  Take tylenol or motrin for pain or fever     ED Prescriptions     Medication Sig Dispense Auth. Provider   predniSONE (STERAPRED UNI-PAK 21 TAB) 10 MG (21) TBPK tablet Take by mouth daily. Take 6 tabs by mouth daily  for 2 days, then 5 tabs for 2 days, then 4 tabs for 2 days, then 3 tabs for 2 days, 2 tabs for 2 days, then 1 tab by mouth daily for 2 days 42 tablet Morley Kos L, NP   albuterol (VENTOLIN HFA) 108 (90 Base) MCG/ACT inhaler Inhale 1-2 puffs into the lungs every 6 (six) hours as needed for wheezing or shortness of breath. 18 g Morley Kos L, NP   benzonatate (TESSALON) 100 MG capsule Take 1 capsule (100 mg total) by mouth every 8 (eight) hours. 21 capsule Marney Setting, NP      PDMP not reviewed this encounter.   Marney Setting, NP 04/27/21 1214

## 2021-04-27 NOTE — Discharge Instructions (Addendum)
Take cough meds as needed  Cont the otc cold meds as needed  Use inhaler as needed for cough or sob  Go to er if symptoms become worse  Use humidifier as needed  Take tylenol or motrin for pain or fever Flu test negative

## 2021-04-27 NOTE — ED Triage Notes (Signed)
For a couple days having dry cough, congestion and chills. Tried Theraflu, mucinex and cough drops.

## 2022-07-25 ENCOUNTER — Ambulatory Visit (HOSPITAL_COMMUNITY)
Admission: EM | Admit: 2022-07-25 | Discharge: 2022-07-25 | Disposition: A | Discharge status: Home / Self Care | Payer: Non-veteran care | Attending: Internal Medicine | Admitting: Internal Medicine

## 2022-07-25 ENCOUNTER — Encounter (HOSPITAL_COMMUNITY): Payer: Self-pay

## 2022-07-25 DIAGNOSIS — R058 Other specified cough: Secondary | ICD-10-CM

## 2022-07-25 DIAGNOSIS — R0982 Postnasal drip: Secondary | ICD-10-CM | POA: Diagnosis not present

## 2022-07-25 DIAGNOSIS — R053 Chronic cough: Secondary | ICD-10-CM

## 2022-07-25 MED ORDER — BENZONATATE 100 MG PO CAPS: 100.0000 mg | ORAL_CAPSULE | Freq: Three times a day (TID) | ORAL | 0 refills | Status: AC

## 2022-07-25 MED ORDER — CETIRIZINE HCL 10 MG PO TABS: 10.0000 mg | ORAL_TABLET | Freq: Every day | ORAL | 2 refills | Status: AC

## 2022-07-25 NOTE — ED Provider Notes (Signed)
[place]    CSN: [id number] Arrival date & time: 07/25/22  1321      History   Chief Complaint Chief Complaint  Patient presents with   Cough    HPI Rachael Fleming is a 62 y.o. female.   Patient presents to urgent care for evaluation of persistent dry cough and chest congestion that started approximately 3 weeks ago with viral URI symptoms.  Viral URI symptoms have since improved and cough has lingered.  Patient has been seen twice in the last 3 weeks and has had 2 rounds of antibiotics as well as 2 rounds of prednisone steroid.  She is currently taking a 10-day course of prednisone to help with cough but states that it helped slightly initially and cough is persistent.  Cough is dry, nonproductive, and worse at nighttime.  She has been using albuterol as needed for cough, shortness of breath, and wheeze without much relief.  She continues to have rhinorrhea and postnasal drainage.  Denies history of chronic respiratory problems.  She is not a smoker and denies drug use.  Denies leg swelling, orthopnea, abdominal pain, nausea, vomiting, headache, ear pain, sore throat, and fever/chills.  No heart palpitations, shortness of breath, or chest pain.  She has not been using any over-the-counter or prescription cough medication to help with symptoms.  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    Cough   Past Medical History:  Diagnosis Date   Abnormal Pap smear 1984   Cryo   Anxiety    ARTHRITIS    Arthritis    osteoarthritis of bilateral shoulders   DEPRESSION    PTSD complication, working at [employer]   GERD    GLAUCOMA    Glaucoma    Heart palpitations 03/2013   Hyperlipidemia    HYPERTENSION    Migraine    Vertigo     Patient Active Problem List   Diagnosis Date Noted   Chronic left shoulder pain 09/06/2016   Insomnia 08/29/2014   Syncope 04/03/2014   Chronic post-traumatic stress disorder (PTSD) 04/03/2014    Depression 09/28/2008   GLAUCOMA 09/28/2008    Past Surgical History:  Procedure Laterality Date   ABDOMINAL HYSTERECTOMY  2008   TVH   Bilateral breast reduction  1998   bladder tack  2008   BREAST SURGERY     CERVIX LESION DESTRUCTION  [place]   COLONOSCOPY  2010   L3-5 decompression  06/2009   SHOULDER SURGERY Right    SKIN GRAFT  2006   SPINE SURGERY     TUBAL LIGATION  1/90    OB History     Gravida  4   Para  2   Term  2   Preterm      AB  2   Living  2      SAB      IAB      Ectopic      Multiple      Live Births  2            Home Medications    Prior to Admission medications   Medication Sig Start Date End Date Taking? Authorizing Provider  benzonatate (TESSALON) 100 MG capsule Take 1 capsule (100 mg total) by mouth every 8 (eight) hours. 07/25/22  Yes Talbot Grumbling, FNP  cetirizine (ZYRTEC) 10 MG tablet Take 1 tablet (10 mg total) by mouth daily. 07/25/22  Yes Talbot Grumbling, FNP  albuterol (VENTOLIN HFA) 108 (90 Base) MCG/ACT inhaler Inhale 1-2 puffs into the lungs every 6 (six) hours as needed for wheezing or shortness of breath. 04/27/21   Marney Setting, NP  aluminum-magnesium hydroxide-simethicone (MAALOX) 200-200-20 MG/5ML SUSP Take 30 mLs by mouth 4 (four) times daily -  before meals and at bedtime. 02/05/21   Hans Eden, NP  aspirin EC 81 MG tablet Take 81 mg by mouth daily.    [provider]  busPIRone (BUSPAR) 10 MG tablet Take 10 mg by mouth 2 (two) times daily.    [provider]  Charcoal Activated (ACTIVATED CHARCOAL) POWD Take 1 capsule by mouth in the morning and at bedtime. 260 mg    [provider]  escitalopram (LEXAPRO) 20 MG tablet Take 100 mg by mouth daily.    [provider]  Eszopiclone 3 MG TABS TAKE 1 TABLET BY MOUTH IMMEDIATELY BEFORE BEDTIME 09/20/16   Hoyt Koch, MD  loperamide (IMODIUM) 2 MG capsule Take 1 capsule (2 mg  total) by mouth 2 (two) times daily as needed for diarrhea or loose stools. 02/05/21   Hans Eden, NP  Multiple Vitamins-Minerals (MULTIVITAMIN & MINERAL PO) Take by mouth daily. Alive 50+ MVI.    [provider]  nitroGLYCERIN (NITRODUR - DOSED IN MG/24 HR) 0.2 mg/hr patch Place 1/4 of patch over affected region. Remove and replace once daily.  Slightly alter skin placement daily Patient not taking: Reported on 01/27/2020 09/06/16   Gerda Diss, DO  PARoxetine (PAXIL) 20 MG tablet Take 1 tablet (20 mg total) by mouth daily. Overdue for f/u appt must see MD for refills 11/29/16   Nche, Charlene Brooke, NP  pravastatin (PRAVACHOL) 40 MG tablet Take 40 mg by mouth daily.    [provider]  Probiotic Product (PROBIOTIC ACIDOPHILUS BEADS) CAPS Take 1 capsule by mouth in the morning and at bedtime.    [provider]  tiZANidine (ZANAFLEX) 4 MG tablet TK 1/2 TO 1 T PO TID PRF SPASM 11/10/15   [provider]  traZODone (DESYREL) 100 MG tablet Take 1 tablet (100 mg total) by mouth at bedtime. 11/29/16   Nche, Charlene Brooke, NP    Family History Family History  Problem Relation Age of Onset   Arthritis Mother    Diabetes Mother    Dementia Mother    Stroke Mother    Hypertension Mother    Alzheimer's disease Mother    Arthritis Father    Cancer Father        prostate, metastatic turned into bone   Stroke Father    Hypertension Father    Alcohol abuse Sister        committed suicide   Diabetes Maternal Grandmother    Cancer Maternal Grandfather    Cancer Paternal Grandmother    Cancer Paternal Grandfather    Coronary artery disease Other 53       Grandfather with MI   Colon cancer Neg Hx    Colon polyps Neg Hx    Esophageal cancer Neg Hx    Rectal cancer Neg Hx    Stomach cancer Neg Hx     Social History Social History   Tobacco Use   Smoking status: Never   Smokeless tobacco: Never   Tobacco comments:    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   Per HPI   Physical Exam Triage Vital Signs ED Triage Vitals  Enc Vitals Group     BP 07/25/22 1442 (!) 168/94     Pulse Rate 07/25/22 1442 67     Resp 07/25/22 1442 18     Temp 07/25/22 1442 98 F (36.7 C)     Temp Source 07/25/22 1442 Oral     SpO2 07/25/22 1442 98 %     Weight --      Height --      Head Circumference --      Peak Flow --      Pain Score 07/25/22 1443 6     Pain Loc --      Pain Edu? --      Excl. in [place]? --    No data found.  Updated Vital Signs BP (!) 168/94 (BP Location: Left Arm)   Pulse 67   Temp 98 F (36.7 C) (Oral)   Resp 18   LMP 12/18/2005   SpO2 98%   Visual Acuity Right Eye Distance:   Left Eye Distance:   Bilateral Distance:    Right Eye Near:   Left Eye Near:    Bilateral Near:     Physical Exam Vitals and nursing note reviewed.  Constitutional:      Appearance: She is not ill-appearing or toxic-appearing.  HENT:     Head: Normocephalic and atraumatic.     Right Ear: Hearing, tympanic membrane, ear canal and external ear normal.     Left Ear: Hearing, tympanic membrane, ear canal and external ear normal.     Nose: Nose normal.     Mouth/Throat:     Lips: Pink.     Mouth: Mucous membranes are moist. No injury.     Tongue: No lesions. Tongue does not deviate from midline.     Palate: No mass and lesions.     Pharynx: Oropharynx is clear. Uvula midline. No pharyngeal swelling, oropharyngeal exudate, posterior oropharyngeal erythema or uvula swelling.     Tonsils: No tonsillar exudate or tonsillar abscesses.  Eyes:     General: Lids are normal. Vision grossly intact. Gaze aligned  appropriately.        Right eye: No discharge.        Left eye: No discharge.     Extraocular Movements: Extraocular movements intact.     Conjunctiva/sclera: Conjunctivae normal.  Cardiovascular:     Rate and Rhythm: Normal rate and regular rhythm.     Heart sounds: Normal heart sounds, S1 normal and S2 normal.  Pulmonary:     Effort: Pulmonary effort is normal. No respiratory distress.     Breath sounds: Normal breath sounds and air entry. No wheezing, rhonchi or rales.     Comments: Harsh, persistent, and dry cough elicited with deep inspiration during lung exam. Chest:     Chest wall: No tenderness.  Abdominal:     General: Bowel sounds are normal.     Palpations: Abdomen is soft.     Tenderness: There is no abdominal tenderness. There is no right CVA tenderness, left CVA tenderness or guarding.  Musculoskeletal:     Cervical back:  Neck supple.     Right lower leg: No edema.     Left lower leg: No edema.  Lymphadenopathy:     Cervical: No cervical adenopathy.  Skin:    General: Skin is warm and dry.     Capillary Refill: Capillary refill takes less than 2 seconds.     Findings: No rash.  Neurological:     General: No focal deficit present.     Mental Status: She is alert and oriented to person, place, and time. Mental status is at baseline.     Cranial Nerves: No dysarthria or facial asymmetry.  Psychiatric:        Mood and Affect: Mood normal.        Speech: Speech normal.        Behavior: Behavior normal.        Thought Content: Thought content normal.        Judgment: Judgment normal.      UC Treatments / Results  Labs (all labs ordered are listed, but only abnormal results are displayed) Labs Reviewed - No data to display  EKG   Radiology No results found.  Procedures Procedures (including critical care time)  Medications Ordered in UC Medications - No data to display  Initial Impression / Assessment and Plan / UC Course  I have reviewed the triage  vital signs and the nursing notes.  Pertinent labs & imaging results that were available during my care of the patient were reviewed by me and considered in my medical decision making (see chart for details).   1.  Persistent cough for 3 weeks or longer, postviral cough syndrome, postnasal drip Presentation is consistent with postviral cough syndrome that will likely improve in the next 1 to 2 weeks.  Postnasal drainage is likely triggering persistent cough.  Patient to take Zyrtec once daily to help dry up postnasal drainage.  Deferred imaging based on stable cardiopulmonary exam and hemodynamically stable vital signs.  No adventitious lung sounds heard to auscultation.  She is oxygenating well on room air at 98% with respiratory rate of 18.  Speaking in full sentences without difficulty.  May take Tessalon Perles every 8 hours as needed for cough.  Promethazine DM may be used at bedtime as needed for cough, drowsiness precautions discussed.  Advise follow-up with PCP should symptoms fail to improve in the next 1 to 2 weeks.  Work note given.  Discussed physical exam and available lab work findings in clinic with patient.  Counseled patient regarding appropriate use of medications and potential side effects for all medications recommended or prescribed today. Discussed red flag signs and symptoms of worsening condition,when to call the PCP office, return to urgent care, and when to seek higher level of care in the emergency department. Patient verbalizes understanding and agreement with plan. All questions answered. Patient discharged in stable condition.    Final Clinical Impressions(s) / UC Diagnoses   Final diagnoses:  Persistent cough for 3 weeks or longer  Post-viral cough syndrome  Postnasal drip     Discharge Instructions      Cough related to viral illnesses can last 2 to 4 weeks. Please take Zyrtec over-the-counter 10 mg once daily at bedtime to help dry up postnasal drainage  contributing to cough. Take Tessalon Perles every 8 hours as needed for cough. You may take Promethazine DM at bedtime as needed for cough, however do not take this during the daytime or drink alcohol/go to work when you take it because  it can make you very sleepy.  Return to urgent care as needed.     ED Prescriptions     Medication Sig Dispense Auth. Provider   cetirizine (ZYRTEC) 10 MG tablet Take 1 tablet (10 mg total) by mouth daily. 30 tablet Joella Prince M, FNP   benzonatate (TESSALON) 100 MG capsule Take 1 capsule (100 mg total) by mouth every 8 (eight) hours. 21 capsule Talbot Grumbling, FNP      PDMP not reviewed this encounter.   Talbot Grumbling, [place] 07/25/22 1529

## 2022-07-25 NOTE — ED Triage Notes (Signed)
Pt c/o cough x3 wks. States on second round of steroids and antibiotics. States last night coughed up blood.

## 2022-07-25 NOTE — Discharge Instructions (Addendum)
Cough related to viral illnesses can last 2 to 4 weeks. Please take Zyrtec over-the-counter 10 mg once daily at bedtime to help dry up postnasal drainage contributing to cough. Take Tessalon Perles every 8 hours as needed for cough. You may take Promethazine DM at bedtime as needed for cough, however do not take this during the daytime or drink alcohol/go to work when you take it because it can make you very sleepy.  Return to urgent care as needed.

## 2022-10-19 IMAGING — DX DG FOOT COMPLETE 3+V*R*
3 series · 3 of 3 positions shown · non-contrast
Comparison: None.

CLINICAL DATA: Crush injury to foot 1 day ago with persistent pain
and swelling, initial encounter

EXAM:
RIGHT FOOT COMPLETE - 3+ VIEW

[foot ap]
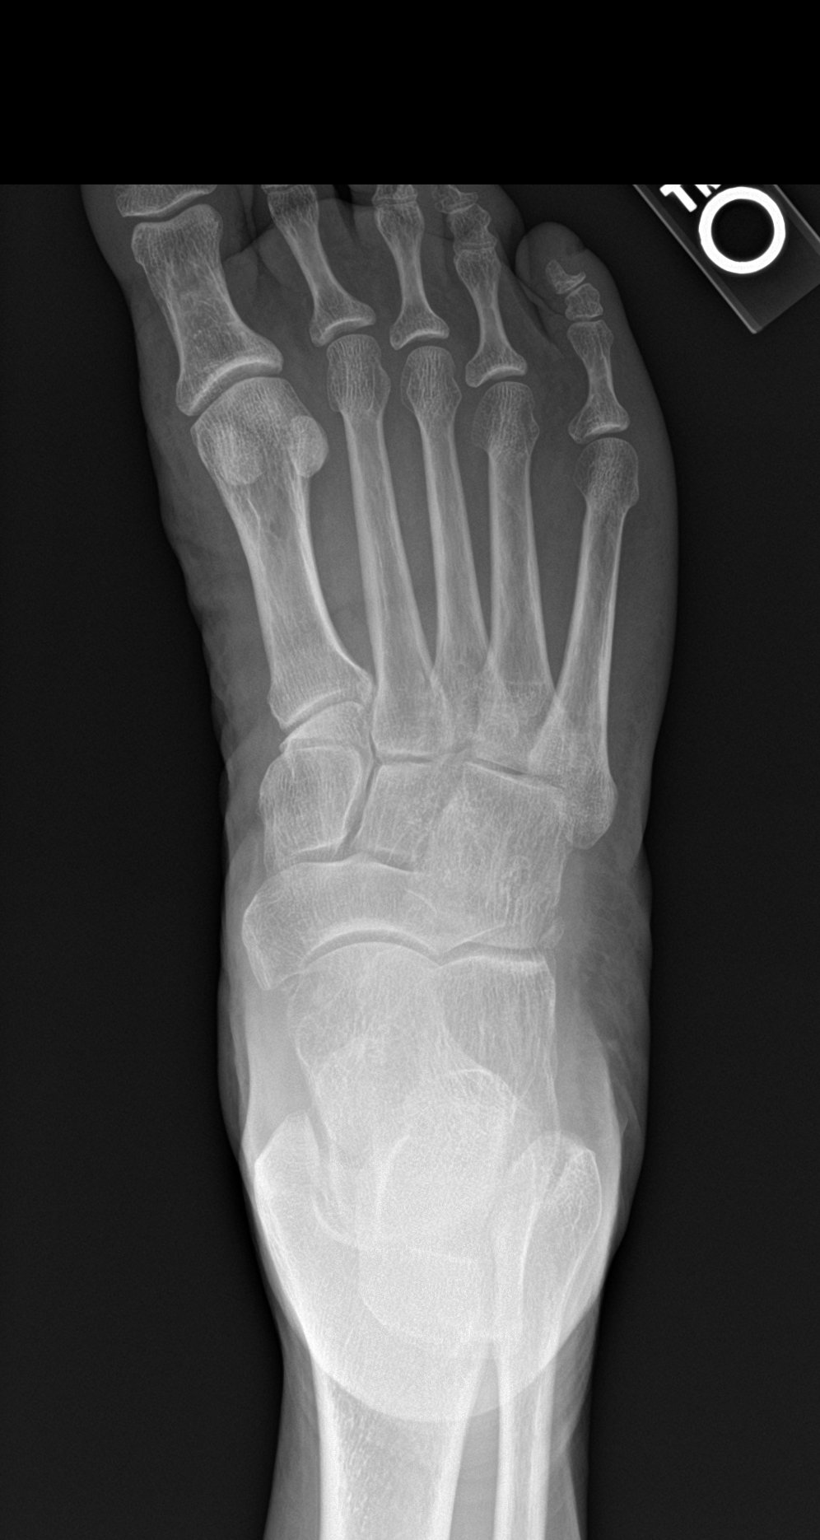

[foot obl]
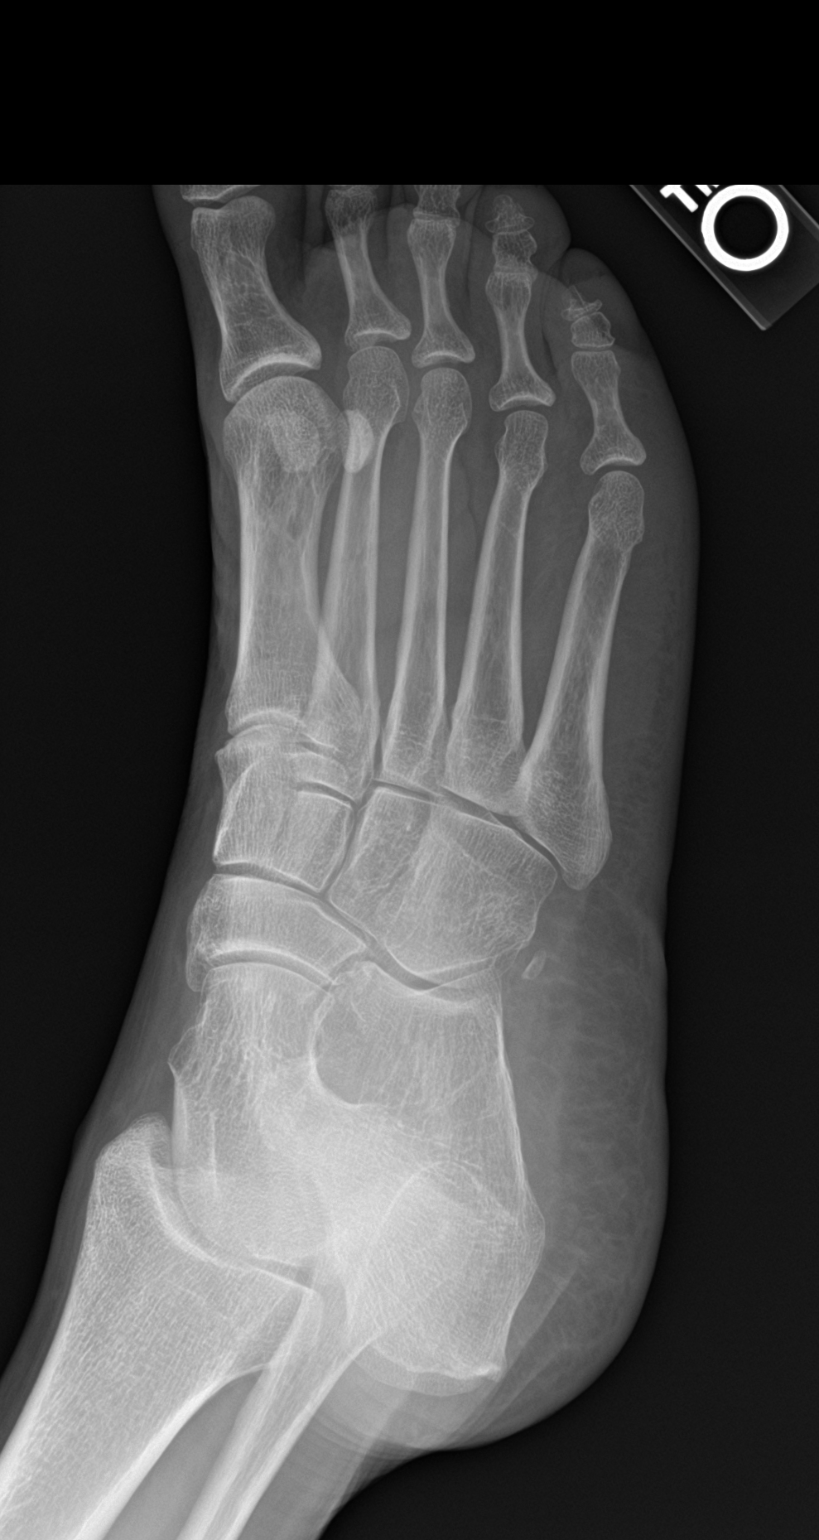

[foot lat]
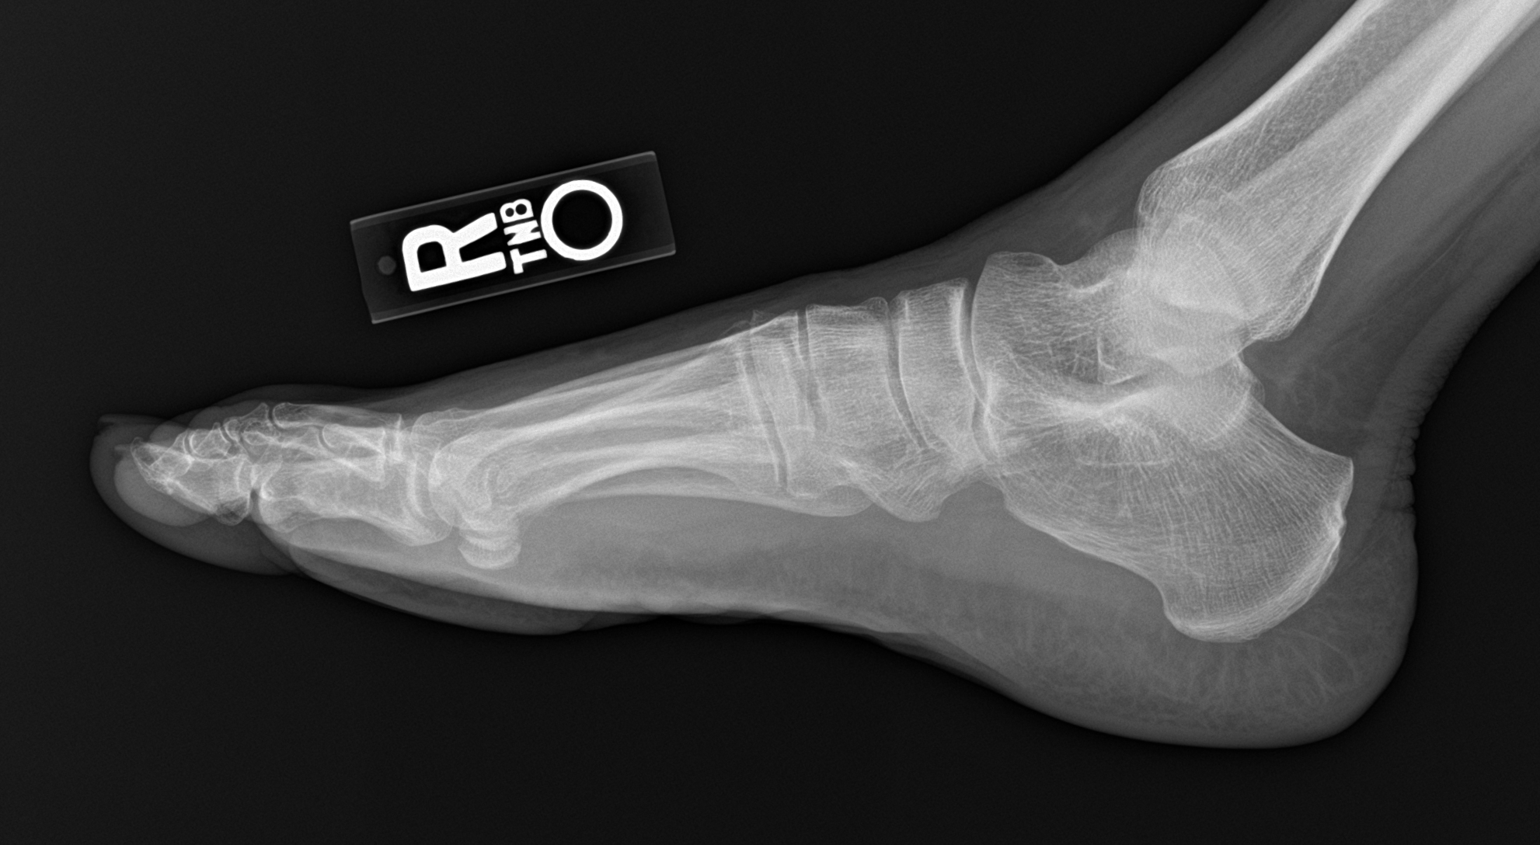

[3 of 3 positions shown; findings below may reference images not displayed]

FINDINGS: There is no evidence of fracture or dislocation. There is no
evidence of arthropathy or other focal bone abnormality. Soft
tissues are unremarkable.
IMPRESSION: No acute abnormality noted.

## 2023-07-13 ENCOUNTER — Encounter (HOSPITAL_COMMUNITY): Payer: Self-pay | Admitting: Emergency Medicine

## 2023-07-13 ENCOUNTER — Emergency Department (HOSPITAL_COMMUNITY)
Admission: EM | Admit: 2023-07-13 | Discharge: 2023-07-13 | Disposition: A | Discharge status: Home / Self Care | Payer: BC Managed Care – PPO | Attending: Emergency Medicine | Admitting: Emergency Medicine

## 2023-07-13 ENCOUNTER — Emergency Department (HOSPITAL_COMMUNITY): Payer: BC Managed Care – PPO

## 2023-07-13 ENCOUNTER — Other Ambulatory Visit: Payer: Self-pay

## 2023-07-13 DIAGNOSIS — I10 Essential (primary) hypertension: Secondary | ICD-10-CM | POA: Insufficient documentation

## 2023-07-13 DIAGNOSIS — W19XXXA Unspecified fall, initial encounter: Secondary | ICD-10-CM | POA: Insufficient documentation

## 2023-07-13 DIAGNOSIS — R1012 Left upper quadrant pain: Secondary | ICD-10-CM | POA: Diagnosis not present

## 2023-07-13 DIAGNOSIS — S20212A Contusion of left front wall of thorax, initial encounter: Secondary | ICD-10-CM | POA: Diagnosis not present

## 2023-07-13 DIAGNOSIS — S299XXA Unspecified injury of thorax, initial encounter: Secondary | ICD-10-CM | POA: Diagnosis present

## 2023-07-13 DIAGNOSIS — Z7982 Long term (current) use of aspirin: Secondary | ICD-10-CM | POA: Insufficient documentation

## 2023-07-13 LAB — COMPREHENSIVE METABOLIC PANEL
ALT: 29 U/L (ref 0–44)
AST: 39 U/L (ref 15–41)
Albumin: 3.9 g/dL (ref 3.5–5.0)
Alkaline Phosphatase: 59 U/L (ref 38–126)
Anion gap: 11 (ref 5–15)
BUN: 18 mg/dL (ref 8–23)
CO2: 26 mmol/L (ref 22–32)
Calcium: 9.8 mg/dL (ref 8.9–10.3)
Chloride: 106 mmol/L (ref 98–111)
Creatinine, Ser: 0.79 mg/dL (ref 0.44–1.00)
GFR, Estimated: >60 mL/min (ref >60)
Glucose, Bld: 76 mg/dL (ref 70–99)
Potassium: 4.6 mmol/L (ref 3.5–5.1)
Sodium: 143 mmol/L (ref 135–145)
Total Bilirubin: 0.7 mg/dL (ref 0.0–1.2)
Total Protein: 7.3 g/dL (ref 6.5–8.1)

## 2023-07-13 LAB — TROPONIN I (HIGH SENSITIVITY): Troponin I (High Sensitivity): <2 ng/L (ref <18)

## 2023-07-13 LAB — URINALYSIS, ROUTINE W REFLEX MICROSCOPIC
Bilirubin Urine: NEGATIVE
Glucose, UA: NEGATIVE mg/dL
Hgb urine dipstick: NEGATIVE
Ketones, ur: NEGATIVE mg/dL
Leukocytes,Ua: NEGATIVE
Nitrite: NEGATIVE
Protein, ur: NEGATIVE mg/dL
Specific Gravity, Urine: 1.019 (ref 1.005–1.030)
pH: 6 (ref 5.0–8.0)

## 2023-07-13 LAB — CBC
HCT: 40.1 % (ref 36.0–46.0)
Hemoglobin: 12.9 g/dL (ref 12.0–15.0)
MCH: 31.2 pg (ref 26.0–34.0)
MCHC: 32.2 g/dL (ref 30.0–36.0)
MCV: 97.1 fL (ref 80.0–100.0)
Platelets: 215 10*3/uL (ref 150–400)
RBC: 4.13 MIL/uL (ref 3.87–5.11)
RDW: 15.5 % (ref 11.5–15.5)
WBC: 6.7 10*3/uL (ref 4.0–10.5)
nRBC: 0 % (ref 0.0–0.2)

## 2023-07-13 LAB — LIPASE, BLOOD: Lipase: 43 U/L (ref 11–51)

## 2023-07-13 MED ORDER — LIDOCAINE 5 % EX PTCH
1.0000 | MEDICATED_PATCH | CUTANEOUS | Status: DC
  Administered 2023-07-13: 1 via TRANSDERMAL
  Filled 2023-07-13: qty 1

## 2023-07-13 MED ORDER — KETOROLAC TROMETHAMINE 15 MG/ML IJ SOLN
15.0000 mg | Freq: Once | INTRAMUSCULAR | Status: AC
  Administered 2023-07-13: 15 mg via INTRAVENOUS
  Filled 2023-07-13: qty 1

## 2023-07-13 MED ORDER — ONDANSETRON HCL 4 MG/2ML IJ SOLN
4.0000 mg | Freq: Once | INTRAMUSCULAR | Status: AC
  Administered 2023-07-13: 4 mg via INTRAVENOUS
  Filled 2023-07-13: qty 2

## 2023-07-13 MED ORDER — LIDOCAINE 4 % EX PTCH: 1.0000 | MEDICATED_PATCH | CUTANEOUS | 0 refills | Status: AC

## 2023-07-13 MED ORDER — LIDOCAINE VISCOUS HCL 2 % MT SOLN
15.0000 mL | Freq: Once | OROMUCOSAL | Status: AC
  Administered 2023-07-13: 15 mL via ORAL
  Filled 2023-07-13: qty 15

## 2023-07-13 MED ORDER — IOHEXOL 300 MG/ML  SOLN
100.0000 mL | Freq: Once | INTRAMUSCULAR | Status: AC | PRN
  Administered 2023-07-13: 100 mL via INTRAVENOUS

## 2023-07-13 MED ORDER — OXYCODONE-ACETAMINOPHEN 5-325 MG PO TABS
1.0000 | ORAL_TABLET | Freq: Once | ORAL | Status: AC
  Administered 2023-07-13: 1 via ORAL
  Filled 2023-07-13: qty 1

## 2023-07-13 MED ORDER — CYCLOBENZAPRINE HCL 10 MG PO TABS
5.0000 mg | ORAL_TABLET | Freq: Once | ORAL | Status: AC
  Administered 2023-07-13: 5 mg via ORAL
  Filled 2023-07-13: qty 1

## 2023-07-13 MED ORDER — MORPHINE SULFATE (PF) 2 MG/ML IV SOLN
2.0000 mg | Freq: Once | INTRAVENOUS | Status: AC
  Administered 2023-07-13: 2 mg via INTRAVENOUS
  Filled 2023-07-13: qty 1

## 2023-07-13 MED ORDER — ALUM & MAG HYDROXIDE-SIMETH 200-200-20 MG/5ML PO SUSP
30.0000 mL | Freq: Once | ORAL | Status: AC
  Administered 2023-07-13: 30 mL via ORAL
  Filled 2023-07-13: qty 30

## 2023-07-13 NOTE — ED Triage Notes (Signed)
 Pt started having abdominal pain Friday morning. Pt having diarrhea but denies N/V.

## 2023-07-13 NOTE — ED Provider Notes (Signed)
 [place] EMERGENCY DEPARTMENT AT [facility] Provider Note   CSN: [id number] Arrival date & time: 07/13/23  1032     History  Chief Complaint  Patient presents with   Abdominal Pain    Rachael Fleming is a 63 y.o. female, history of hypertension, who presents to the ED secondary to left upper quadrant pain, has been going on for the last day.  She states that she had a few episodes of diarrhea yesterday around 4-5 episodes, and then today woke up with some pain in her left upper quadrant, she states it feels like a gas bubble, that was progressively getting bigger, and the pain caused her to double over in pain.  She denies any shortness of breath, recent injuries, history of blood clots.  She has associated nausea, but no vomiting at this time.  Has not tried anything for the pain, but states that sharp and stabbing.    Home Medications Prior to Admission medications   Medication Sig Start Date End Date Taking? Authorizing Provider  lidocaine (HM LIDOCAINE PATCH) 4 % Place 1 patch onto the skin daily. 07/13/23  Yes Demarkus Remmel L, PA  albuterol (VENTOLIN HFA) 108 (90 Base) MCG/ACT inhaler Inhale 1-2 puffs into the lungs every 6 (six) hours as needed for wheezing or shortness of breath. 04/27/21   Coralyn Mark, NP  aluminum-magnesium hydroxide-simethicone (MAALOX) 200-200-20 MG/5ML SUSP Take 30 mLs by mouth 4 (four) times daily -  before meals and at bedtime. 02/05/21   Valinda Hoar, NP  aspirin EC 81 MG tablet Take 81 mg by mouth daily.    [provider]  benzonatate (TESSALON) 100 MG capsule Take 1 capsule (100 mg total) by mouth every 8 (eight) hours. 07/25/22   Carlisle Beers, FNP  busPIRone (BUSPAR) 10 MG tablet Take 10 mg by mouth 2 (two) times daily.    [provider]  cetirizine (ZYRTEC) 10 MG tablet Take 1 tablet (10 mg total) by mouth daily. 07/25/22   Carlisle Beers, FNP  Charcoal Activated (ACTIVATED CHARCOAL) POWD Take 1  capsule by mouth in the morning and at bedtime. 260 mg    [provider]  escitalopram (LEXAPRO) 20 MG tablet Take 100 mg by mouth daily.    [provider]  Eszopiclone 3 MG TABS TAKE 1 TABLET BY MOUTH IMMEDIATELY BEFORE BEDTIME 09/20/16   Myrlene Broker, MD  loperamide (IMODIUM) 2 MG capsule Take 1 capsule (2 mg total) by mouth 2 (two) times daily as needed for diarrhea or loose stools. 02/05/21   Valinda Hoar, NP  Multiple Vitamins-Minerals (MULTIVITAMIN & MINERAL PO) Take by mouth daily. Alive 50+ MVI.    [provider]  nitroGLYCERIN (NITRODUR - DOSED IN MG/24 HR) 0.2 mg/hr patch Place 1/4 of patch over affected region. Remove and replace once daily.  Slightly alter skin placement daily Patient not taking: Reported on 01/27/2020 09/06/16   Andrena Mews, DO  PARoxetine (PAXIL) 20 MG tablet Take 1 tablet (20 mg total) by mouth daily. Overdue for f/u appt must see MD for refills 11/29/16   Nche, Bonna Gains, NP  pravastatin (PRAVACHOL) 40 MG tablet Take 40 mg by mouth daily.    [provider]  Probiotic Product (PROBIOTIC ACIDOPHILUS BEADS) CAPS Take 1 capsule by mouth in the morning and at bedtime.    [provider]  tiZANidine (ZANAFLEX) 4 MG tablet TK 1/2 TO 1 T PO TID PRF SPASM 11/10/15   [provider]  traZODone (DESYREL) 100 MG tablet Take 1 tablet (100 mg total) by mouth at bedtime. 11/29/16   Nche, Bonna Gains, NP      Allergies    Penicillins    Review of Systems   Review of Systems  Respiratory:  Negative for shortness of breath.   Cardiovascular:  Negative for chest pain.  Gastrointestinal:  Positive for abdominal pain.    Physical Exam Updated Vital Signs BP (!) 153/87 (BP Location: Right Arm)   Pulse (!) 57   Temp 98 F (36.7 C) (Oral)   Resp 15   Ht 5\' 2"  (1.575 m)   Wt 52.2 kg   LMP 12/18/2005   SpO2 98%   BMI 21.03 kg/m  Physical Exam Vitals and nursing note reviewed.  Constitutional:       General: She is not in acute distress.    Appearance: She is well-developed.  HENT:     Head: Normocephalic and atraumatic.  Eyes:     Conjunctiva/sclera: Conjunctivae normal.  Cardiovascular:     Rate and Rhythm: Normal rate and regular rhythm.     Heart sounds: No murmur heard. Pulmonary:     Effort: Pulmonary effort is normal. No respiratory distress.     Breath sounds: Normal breath sounds.  Abdominal:     Palpations: Abdomen is soft.     Tenderness: There is abdominal tenderness in the left upper quadrant. There is guarding.  Musculoskeletal:        General: No swelling.     Cervical back: Neck supple.  Skin:    General: Skin is warm and dry.     Capillary Refill: Capillary refill takes less than 2 seconds.  Neurological:     Mental Status: She is alert.  Psychiatric:        Mood and Affect: Mood normal.     ED Results / Procedures / Treatments   Labs (all labs ordered are listed, but only abnormal results are displayed) Labs Reviewed  LIPASE, BLOOD  COMPREHENSIVE METABOLIC PANEL  CBC  URINALYSIS, ROUTINE W REFLEX MICROSCOPIC  TROPONIN I (HIGH SENSITIVITY)    EKG EKG Interpretation Date/Time:  Sunday July 13 2023 16:15:25 EST Ventricular Rate:  60 PR Interval:  158 QRS Duration:  93 QT Interval:  426 QTC Calculation: 426 R Axis:   48  Text Interpretation: Sinus rhythm Borderline T abnormalities, inferior leads Confirmed by Virgina Norfolk [phone]) on 07/13/2023 4:17:57 PM  Radiology DG Ribs Unilateral W/Chest Left Result Date: 07/13/2023 CLINICAL DATA:  Larey Seat, left rib pain EXAM: LEFT RIBS AND CHEST - 3+ VIEW COMPARISON:  05/04/2015 FINDINGS: Frontal view of the chest as well as frontal and oblique views of the left thoracic cage are obtained. Cardiac silhouette is unremarkable. No airspace disease, effusion, or pneumothorax. There are no acute displaced fractures. IMPRESSION: 1. No acute intrathoracic process.  No displaced rib fracture. Electronically  Signed   By: Sharlet Salina M.D.   On: 07/13/2023 14:55   CT ABDOMEN PELVIS W CONTRAST Result Date: 07/13/2023 CLINICAL DATA:  LEFT upper quadrant abdominal pain EXAM: CT ABDOMEN AND PELVIS WITH CONTRAST TECHNIQUE: Multidetector CT imaging of the abdomen and pelvis was performed using the standard protocol following bolus administration of intravenous contrast. RADIATION DOSE REDUCTION: This exam was performed according to the departmental dose-optimization program which includes automated exposure control, adjustment of the mA and/or kV according to patient size and/or use of iterative reconstruction technique. CONTRAST:  OMNIPAQUE IOHEXOL 300 MG/ML  SOLN COMPARISON:  None Available. FINDINGS: Lower chest: Lung bases are clear. Hepatobiliary: No focal hepatic lesion. Normal gallbladder. No biliary duct dilatation. Common bile duct is normal. Pancreas: Pancreas is normal. No ductal dilatation. No pancreatic inflammation. Spleen: Normal Adrenals/urinary tract: Adrenal glands and kidneys are normal. The ureters and bladder normal. Stomach/Bowel: The stomach, duodenum, and Eileene Kisling bowel normal. The colon and rectosigmoid colon are normal. Vascular/Lymphatic: Abdominal aorta is normal caliber. No periportal or retroperitoneal adenopathy. No pelvic adenopathy. Reproductive: Post hysterectomy.  Adnexa unremarkable Other: No free fluid. Musculoskeletal: Bilateral pars defects at L3 with 7 mm anterolisthesis of L3 on L4. IMPRESSION: 1. No acute findings in the abdomen pelvis. 2. Bilateral pars defects at L 3 with grade 1 spondylolisthesis. Electronically Signed   By: Genevive Bi M.D.   On: 07/13/2023 13:12    Procedures Procedures    Medications Ordered in ED Medications  lidocaine (LIDODERM) 5 % 1 patch (1 patch Transdermal Patch Applied 07/13/23 1504)  ketorolac (TORADOL) 15 MG/ML injection 15 mg (15 mg Intravenous Given 07/13/23 1218)  ondansetron (ZOFRAN) injection 4 mg (4 mg Intravenous Given  07/13/23 1219)  iohexol (OMNIPAQUE) 300 MG/ML solution 100 mL (100 mLs Intravenous Contrast Given 07/13/23 1244)  alum & mag hydroxide-simeth (MAALOX/MYLANTA) 200-200-20 MG/5ML suspension 30 mL (30 mLs Oral Given 07/13/23 1332)    And  lidocaine (XYLOCAINE) 2 % viscous mouth solution 15 mL (15 mLs Oral Given 07/13/23 1332)  morphine (PF) 2 MG/ML injection 2 mg (2 mg Intravenous Given 07/13/23 1506)  cyclobenzaprine (FLEXERIL) tablet 5 mg (5 mg Oral Given 07/13/23 1505)  oxyCODONE-acetaminophen (PERCOCET/ROXICET) 5-325 MG per tablet 1 tablet (1 tablet Oral Given 07/13/23 1626)    ED Course/ Medical Decision Making/ A&P                                 Medical Decision Making Patient is a 63 year old female, here for left upper quadrant pain, this been going on for last day.  She states that she is not having shortness of breath, chest pain, but that she feels like there is some gas and pressure in her left upper quadrant.  She denies any recent surgeries, no history of blood clots, pain is not pleuritic.  States is progressively gotten worse.  We obtain CT ab pelvis, given her severe left upper quadrant pain, possible diverticulitis?,  She is also having some diarrhea  Amount and/or Complexity of Data Reviewed Labs: ordered.    Details: Blood work is unremarkable Radiology: ordered.    Details: CT abdomen pelvis showed no acute findings ECG/medicine tests:  Decision-making details documented in ED Course. Discussion of management or test interpretation with external provider(s): Discussed with patient, upon further exam, no relief with the Maalox, CT abdomen pelvis is unremarkable, we discussed further, and patient now admits that she fell yesterday, and fell on her left side.  On my exam there is no wounds, or ecchymosis however given the severe tenderness to palpation we will obtain a left rib cage, for further evaluation for x-ray.  X-ray is unremarkable, I believe that the patient likely has some  costochondritis, which is causing the pain, rib contusion, we discussed lidocaine patch, Tylenol and ibuprofen for relief.  Risk OTC drugs. Prescription drug management.   Final Clinical Impression(s) / ED Diagnoses Final diagnoses:  LUQ pain  Contusion of rib on left side, initial encounter    Rx / DC Orders ED Discharge Orders  Ordered    lidocaine (HM LIDOCAINE PATCH) 4 %  Every 24 hours        07/13/23 1629              Jachai Okazaki, [place], [place] 07/13/23 1917    Rolan Bucco, MD 07/14/23 1023

## 2023-07-13 NOTE — Discharge Instructions (Addendum)
 Please follow-up with your PCP. Your labs are reassuring. Take the ibuprofen and tylenol as instructed.  Use warm compresses, to help with the pain, return if you have worsening pain, shortness of breath, or severe abdominal pain
